# Patient Record
Sex: Male | Born: 1935
Health system: Southern US, Community
[De-identification: ages and names within clinical notes are randomized; demographics above are authoritative.]

## PROBLEM LIST (undated history)

## (undated) DIAGNOSIS — E785 Hyperlipidemia, unspecified: Secondary | ICD-10-CM

## (undated) DIAGNOSIS — J309 Allergic rhinitis, unspecified: Secondary | ICD-10-CM

## (undated) DIAGNOSIS — K5732 Diverticulitis of large intestine without perforation or abscess without bleeding: Secondary | ICD-10-CM

## (undated) DIAGNOSIS — I10 Essential (primary) hypertension: Secondary | ICD-10-CM

## (undated) DIAGNOSIS — I69398 Other sequelae of cerebral infarction: Secondary | ICD-10-CM

## (undated) DIAGNOSIS — Z9861 Coronary angioplasty status: Secondary | ICD-10-CM

## (undated) DIAGNOSIS — K219 Gastro-esophageal reflux disease without esophagitis: Secondary | ICD-10-CM

## (undated) DIAGNOSIS — I452 Bifascicular block: Secondary | ICD-10-CM

## (undated) DIAGNOSIS — R209 Unspecified disturbances of skin sensation: Secondary | ICD-10-CM

## (undated) DIAGNOSIS — K5900 Constipation, unspecified: Secondary | ICD-10-CM

## (undated) DIAGNOSIS — F329 Major depressive disorder, single episode, unspecified: Secondary | ICD-10-CM

## (undated) DIAGNOSIS — I2510 Atherosclerotic heart disease of native coronary artery without angina pectoris: Secondary | ICD-10-CM

## (undated) DIAGNOSIS — M549 Dorsalgia, unspecified: Secondary | ICD-10-CM

## (undated) DIAGNOSIS — G8929 Other chronic pain: Secondary | ICD-10-CM

## (undated) DIAGNOSIS — F32A Depression, unspecified: Secondary | ICD-10-CM

## (undated) DIAGNOSIS — G062 Extradural and subdural abscess, unspecified: Secondary | ICD-10-CM

## (undated) DIAGNOSIS — M4800 Spinal stenosis, site unspecified: Secondary | ICD-10-CM

## (undated) DIAGNOSIS — M5137 Other intervertebral disc degeneration, lumbosacral region: Secondary | ICD-10-CM

## (undated) DIAGNOSIS — M5030 Other cervical disc degeneration, unspecified cervical region: Secondary | ICD-10-CM

## (undated) HISTORY — DX: Essential (primary) hypertension: I10

## (undated) HISTORY — PX: BACK SURGERY: SHX140

## (undated) HISTORY — DX: Diverticulitis of large intestine without perforation or abscess without bleeding: K57.32

## (undated) HISTORY — DX: Other cervical disc degeneration, unspecified cervical region: M50.30

## (undated) HISTORY — DX: Bifascicular block: I45.2

## (undated) HISTORY — DX: Gastro-esophageal reflux disease without esophagitis: K21.9

## (undated) HISTORY — DX: Spinal stenosis, site unspecified: M48.00

## (undated) HISTORY — DX: Atherosclerotic heart disease of native coronary artery without angina pectoris: I25.10

## (undated) HISTORY — DX: Other chronic pain: G89.29

## (undated) HISTORY — DX: Unspecified disturbances of skin sensation: R20.9

## (undated) HISTORY — DX: Dorsalgia, unspecified: M54.9

## (undated) HISTORY — DX: Constipation, unspecified: K59.00

## (undated) HISTORY — DX: Other sequelae of cerebral infarction: I69.398

## (undated) HISTORY — DX: Allergic rhinitis, unspecified: J30.9

## (undated) HISTORY — DX: Extradural and subdural abscess, unspecified: G06.2

## (undated) HISTORY — DX: Other intervertebral disc degeneration, lumbosacral region: M51.37

## (undated) HISTORY — DX: Coronary angioplasty status: Z98.61

## (undated) HISTORY — DX: Major depressive disorder, single episode, unspecified: F32.9

## (undated) HISTORY — DX: Depression, unspecified: F32.A

## (undated) HISTORY — PX: MOUTH SURGERY: SHX715

## (undated) HISTORY — DX: Hyperlipidemia, unspecified: E78.5

## (undated) HISTORY — PX: LUMBAR DISC SURGERY: SHX700

---

## 2007-06-10 DIAGNOSIS — M51379 Other intervertebral disc degeneration, lumbosacral region without mention of lumbar back pain or lower extremity pain: Secondary | ICD-10-CM

## 2007-06-10 DIAGNOSIS — M5137 Other intervertebral disc degeneration, lumbosacral region: Secondary | ICD-10-CM

## 2007-06-10 HISTORY — DX: Other intervertebral disc degeneration, lumbosacral region without mention of lumbar back pain or lower extremity pain: M51.379

## 2007-06-10 HISTORY — DX: Other intervertebral disc degeneration, lumbosacral region: M51.37

## 2007-08-14 HISTORY — PX: LAMINECTOMY: SHX219

## 2009-06-09 DIAGNOSIS — K5900 Constipation, unspecified: Secondary | ICD-10-CM

## 2009-06-09 HISTORY — DX: Constipation, unspecified: K59.00

## 2014-08-24 DIAGNOSIS — M5416 Radiculopathy, lumbar region: Secondary | ICD-10-CM | POA: Diagnosis not present

## 2014-09-24 DIAGNOSIS — M961 Postlaminectomy syndrome, not elsewhere classified: Secondary | ICD-10-CM | POA: Diagnosis not present

## 2014-09-24 DIAGNOSIS — M5136 Other intervertebral disc degeneration, lumbar region: Secondary | ICD-10-CM | POA: Diagnosis not present

## 2014-09-24 DIAGNOSIS — M4322 Fusion of spine, cervical region: Secondary | ICD-10-CM | POA: Diagnosis not present

## 2014-11-29 DIAGNOSIS — Z961 Presence of intraocular lens: Secondary | ICD-10-CM | POA: Diagnosis not present

## 2014-11-29 DIAGNOSIS — H04123 Dry eye syndrome of bilateral lacrimal glands: Secondary | ICD-10-CM | POA: Diagnosis not present

## 2014-12-10 DIAGNOSIS — S86912A Strain of unspecified muscle(s) and tendon(s) at lower leg level, left leg, initial encounter: Secondary | ICD-10-CM | POA: Diagnosis not present

## 2014-12-10 DIAGNOSIS — M79605 Pain in left leg: Secondary | ICD-10-CM | POA: Diagnosis not present

## 2014-12-10 DIAGNOSIS — Z9889 Other specified postprocedural states: Secondary | ICD-10-CM | POA: Diagnosis not present

## 2014-12-28 DIAGNOSIS — L821 Other seborrheic keratosis: Secondary | ICD-10-CM | POA: Diagnosis not present

## 2014-12-28 DIAGNOSIS — D485 Neoplasm of uncertain behavior of skin: Secondary | ICD-10-CM | POA: Diagnosis not present

## 2014-12-28 DIAGNOSIS — L820 Inflamed seborrheic keratosis: Secondary | ICD-10-CM | POA: Diagnosis not present

## 2014-12-28 DIAGNOSIS — C44319 Basal cell carcinoma of skin of other parts of face: Secondary | ICD-10-CM | POA: Diagnosis not present

## 2014-12-28 DIAGNOSIS — L309 Dermatitis, unspecified: Secondary | ICD-10-CM | POA: Diagnosis not present

## 2015-01-05 DIAGNOSIS — C44319 Basal cell carcinoma of skin of other parts of face: Secondary | ICD-10-CM | POA: Diagnosis not present

## 2015-01-05 DIAGNOSIS — C61 Malignant neoplasm of prostate: Secondary | ICD-10-CM | POA: Diagnosis not present

## 2015-01-05 DIAGNOSIS — L820 Inflamed seborrheic keratosis: Secondary | ICD-10-CM | POA: Diagnosis not present

## 2015-01-06 DIAGNOSIS — M21372 Foot drop, left foot: Secondary | ICD-10-CM | POA: Diagnosis not present

## 2015-01-06 DIAGNOSIS — M961 Postlaminectomy syndrome, not elsewhere classified: Secondary | ICD-10-CM | POA: Diagnosis not present

## 2015-01-06 DIAGNOSIS — M4322 Fusion of spine, cervical region: Secondary | ICD-10-CM | POA: Diagnosis not present

## 2015-01-06 DIAGNOSIS — M5136 Other intervertebral disc degeneration, lumbar region: Secondary | ICD-10-CM | POA: Diagnosis not present

## 2015-01-14 DIAGNOSIS — M4806 Spinal stenosis, lumbar region: Secondary | ICD-10-CM | POA: Diagnosis not present

## 2015-01-14 DIAGNOSIS — M47817 Spondylosis without myelopathy or radiculopathy, lumbosacral region: Secondary | ICD-10-CM | POA: Diagnosis not present

## 2015-01-14 DIAGNOSIS — M5124 Other intervertebral disc displacement, thoracic region: Secondary | ICD-10-CM | POA: Diagnosis not present

## 2015-01-14 DIAGNOSIS — M5126 Other intervertebral disc displacement, lumbar region: Secondary | ICD-10-CM | POA: Diagnosis not present

## 2015-01-25 DIAGNOSIS — G894 Chronic pain syndrome: Secondary | ICD-10-CM | POA: Diagnosis not present

## 2015-01-25 DIAGNOSIS — M533 Sacrococcygeal disorders, not elsewhere classified: Secondary | ICD-10-CM | POA: Diagnosis not present

## 2015-01-25 DIAGNOSIS — Z5181 Encounter for therapeutic drug level monitoring: Secondary | ICD-10-CM | POA: Diagnosis not present

## 2015-01-25 DIAGNOSIS — G8929 Other chronic pain: Secondary | ICD-10-CM | POA: Diagnosis not present

## 2015-01-25 DIAGNOSIS — M961 Postlaminectomy syndrome, not elsewhere classified: Secondary | ICD-10-CM | POA: Diagnosis not present

## 2015-01-25 DIAGNOSIS — Z79899 Other long term (current) drug therapy: Secondary | ICD-10-CM | POA: Diagnosis not present

## 2015-01-26 DIAGNOSIS — I2510 Atherosclerotic heart disease of native coronary artery without angina pectoris: Secondary | ICD-10-CM | POA: Diagnosis not present

## 2015-01-26 DIAGNOSIS — R7301 Impaired fasting glucose: Secondary | ICD-10-CM | POA: Diagnosis not present

## 2015-01-26 DIAGNOSIS — E785 Hyperlipidemia, unspecified: Secondary | ICD-10-CM | POA: Diagnosis not present

## 2015-01-26 DIAGNOSIS — I10 Essential (primary) hypertension: Secondary | ICD-10-CM | POA: Diagnosis not present

## 2015-02-01 DIAGNOSIS — M533 Sacrococcygeal disorders, not elsewhere classified: Secondary | ICD-10-CM | POA: Diagnosis not present

## 2015-02-23 DIAGNOSIS — M533 Sacrococcygeal disorders, not elsewhere classified: Secondary | ICD-10-CM | POA: Diagnosis not present

## 2015-02-23 DIAGNOSIS — M1990 Unspecified osteoarthritis, unspecified site: Secondary | ICD-10-CM | POA: Diagnosis not present

## 2015-02-23 DIAGNOSIS — M961 Postlaminectomy syndrome, not elsewhere classified: Secondary | ICD-10-CM | POA: Diagnosis not present

## 2015-02-23 DIAGNOSIS — T7840XA Allergy, unspecified, initial encounter: Secondary | ICD-10-CM | POA: Diagnosis not present

## 2015-02-23 DIAGNOSIS — I2510 Atherosclerotic heart disease of native coronary artery without angina pectoris: Secondary | ICD-10-CM | POA: Diagnosis not present

## 2015-03-04 DIAGNOSIS — G4762 Sleep related leg cramps: Secondary | ICD-10-CM | POA: Diagnosis not present

## 2015-03-04 DIAGNOSIS — I2510 Atherosclerotic heart disease of native coronary artery without angina pectoris: Secondary | ICD-10-CM | POA: Diagnosis not present

## 2015-03-04 DIAGNOSIS — M5137 Other intervertebral disc degeneration, lumbosacral region: Secondary | ICD-10-CM | POA: Diagnosis not present

## 2015-03-04 DIAGNOSIS — M4806 Spinal stenosis, lumbar region: Secondary | ICD-10-CM | POA: Diagnosis not present

## 2015-03-23 DIAGNOSIS — M5388 Other specified dorsopathies, sacral and sacrococcygeal region: Secondary | ICD-10-CM | POA: Diagnosis not present

## 2015-03-31 DIAGNOSIS — L720 Epidermal cyst: Secondary | ICD-10-CM | POA: Diagnosis not present

## 2015-03-31 DIAGNOSIS — Z08 Encounter for follow-up examination after completed treatment for malignant neoplasm: Secondary | ICD-10-CM | POA: Diagnosis not present

## 2015-03-31 DIAGNOSIS — Z85828 Personal history of other malignant neoplasm of skin: Secondary | ICD-10-CM | POA: Diagnosis not present

## 2015-03-31 DIAGNOSIS — L905 Scar conditions and fibrosis of skin: Secondary | ICD-10-CM | POA: Diagnosis not present

## 2015-04-20 DIAGNOSIS — I2510 Atherosclerotic heart disease of native coronary artery without angina pectoris: Secondary | ICD-10-CM | POA: Diagnosis not present

## 2015-04-20 DIAGNOSIS — M79672 Pain in left foot: Secondary | ICD-10-CM | POA: Diagnosis not present

## 2015-04-20 DIAGNOSIS — G894 Chronic pain syndrome: Secondary | ICD-10-CM | POA: Diagnosis not present

## 2015-04-20 DIAGNOSIS — M1990 Unspecified osteoarthritis, unspecified site: Secondary | ICD-10-CM | POA: Diagnosis not present

## 2015-04-20 DIAGNOSIS — M961 Postlaminectomy syndrome, not elsewhere classified: Secondary | ICD-10-CM | POA: Diagnosis not present

## 2015-05-17 DIAGNOSIS — M1990 Unspecified osteoarthritis, unspecified site: Secondary | ICD-10-CM | POA: Diagnosis not present

## 2015-05-17 DIAGNOSIS — Z23 Encounter for immunization: Secondary | ICD-10-CM | POA: Diagnosis not present

## 2015-05-17 DIAGNOSIS — I2510 Atherosclerotic heart disease of native coronary artery without angina pectoris: Secondary | ICD-10-CM | POA: Diagnosis not present

## 2015-05-17 DIAGNOSIS — M79672 Pain in left foot: Secondary | ICD-10-CM | POA: Diagnosis not present

## 2015-05-17 DIAGNOSIS — M961 Postlaminectomy syndrome, not elsewhere classified: Secondary | ICD-10-CM | POA: Diagnosis not present

## 2015-05-17 DIAGNOSIS — G894 Chronic pain syndrome: Secondary | ICD-10-CM | POA: Diagnosis not present

## 2015-05-18 DIAGNOSIS — R42 Dizziness and giddiness: Secondary | ICD-10-CM | POA: Diagnosis not present

## 2015-05-18 DIAGNOSIS — W19XXXA Unspecified fall, initial encounter: Secondary | ICD-10-CM | POA: Diagnosis not present

## 2015-05-18 DIAGNOSIS — G319 Degenerative disease of nervous system, unspecified: Secondary | ICD-10-CM | POA: Diagnosis not present

## 2015-05-18 DIAGNOSIS — N390 Urinary tract infection, site not specified: Secondary | ICD-10-CM | POA: Diagnosis not present

## 2015-05-31 DIAGNOSIS — G8929 Other chronic pain: Secondary | ICD-10-CM | POA: Diagnosis not present

## 2015-06-30 DIAGNOSIS — Z85828 Personal history of other malignant neoplasm of skin: Secondary | ICD-10-CM | POA: Diagnosis not present

## 2015-06-30 DIAGNOSIS — L905 Scar conditions and fibrosis of skin: Secondary | ICD-10-CM | POA: Diagnosis not present

## 2015-06-30 DIAGNOSIS — L570 Actinic keratosis: Secondary | ICD-10-CM | POA: Diagnosis not present

## 2015-06-30 DIAGNOSIS — Z08 Encounter for follow-up examination after completed treatment for malignant neoplasm: Secondary | ICD-10-CM | POA: Diagnosis not present

## 2015-06-30 DIAGNOSIS — L821 Other seborrheic keratosis: Secondary | ICD-10-CM | POA: Diagnosis not present

## 2015-07-04 DIAGNOSIS — M961 Postlaminectomy syndrome, not elsewhere classified: Secondary | ICD-10-CM | POA: Diagnosis not present

## 2015-07-04 DIAGNOSIS — G8929 Other chronic pain: Secondary | ICD-10-CM | POA: Diagnosis not present

## 2015-07-04 DIAGNOSIS — M533 Sacrococcygeal disorders, not elsewhere classified: Secondary | ICD-10-CM | POA: Diagnosis not present

## 2015-07-04 DIAGNOSIS — G894 Chronic pain syndrome: Secondary | ICD-10-CM | POA: Diagnosis not present

## 2015-07-19 DIAGNOSIS — M533 Sacrococcygeal disorders, not elsewhere classified: Secondary | ICD-10-CM | POA: Diagnosis not present

## 2015-07-19 DIAGNOSIS — M47814 Spondylosis without myelopathy or radiculopathy, thoracic region: Secondary | ICD-10-CM | POA: Diagnosis not present

## 2015-07-19 DIAGNOSIS — Z981 Arthrodesis status: Secondary | ICD-10-CM | POA: Diagnosis not present

## 2015-08-16 DIAGNOSIS — Z0000 Encounter for general adult medical examination without abnormal findings: Secondary | ICD-10-CM | POA: Diagnosis not present

## 2015-08-16 DIAGNOSIS — E785 Hyperlipidemia, unspecified: Secondary | ICD-10-CM | POA: Diagnosis not present

## 2015-08-16 DIAGNOSIS — R7301 Impaired fasting glucose: Secondary | ICD-10-CM | POA: Diagnosis not present

## 2015-08-16 DIAGNOSIS — Z125 Encounter for screening for malignant neoplasm of prostate: Secondary | ICD-10-CM | POA: Diagnosis not present

## 2015-08-16 DIAGNOSIS — I2510 Atherosclerotic heart disease of native coronary artery without angina pectoris: Secondary | ICD-10-CM | POA: Diagnosis not present

## 2015-08-16 DIAGNOSIS — I10 Essential (primary) hypertension: Secondary | ICD-10-CM | POA: Diagnosis not present

## 2015-08-16 DIAGNOSIS — R1319 Other dysphagia: Secondary | ICD-10-CM | POA: Diagnosis not present

## 2015-08-29 DIAGNOSIS — G894 Chronic pain syndrome: Secondary | ICD-10-CM | POA: Diagnosis not present

## 2015-08-29 DIAGNOSIS — M546 Pain in thoracic spine: Secondary | ICD-10-CM | POA: Diagnosis not present

## 2015-08-29 DIAGNOSIS — M533 Sacrococcygeal disorders, not elsewhere classified: Secondary | ICD-10-CM | POA: Diagnosis not present

## 2015-08-29 DIAGNOSIS — M961 Postlaminectomy syndrome, not elsewhere classified: Secondary | ICD-10-CM | POA: Diagnosis not present

## 2015-09-18 DIAGNOSIS — I2510 Atherosclerotic heart disease of native coronary artery without angina pectoris: Secondary | ICD-10-CM | POA: Diagnosis not present

## 2015-09-18 DIAGNOSIS — Z884 Allergy status to anesthetic agent status: Secondary | ICD-10-CM | POA: Diagnosis not present

## 2015-09-18 DIAGNOSIS — Z85828 Personal history of other malignant neoplasm of skin: Secondary | ICD-10-CM | POA: Diagnosis not present

## 2015-09-18 DIAGNOSIS — J45909 Unspecified asthma, uncomplicated: Secondary | ICD-10-CM | POA: Diagnosis not present

## 2015-09-18 DIAGNOSIS — I10 Essential (primary) hypertension: Secondary | ICD-10-CM | POA: Diagnosis not present

## 2015-09-18 DIAGNOSIS — Z8546 Personal history of malignant neoplasm of prostate: Secondary | ICD-10-CM | POA: Diagnosis not present

## 2015-09-18 DIAGNOSIS — M1990 Unspecified osteoarthritis, unspecified site: Secondary | ICD-10-CM | POA: Diagnosis not present

## 2015-09-18 DIAGNOSIS — Z885 Allergy status to narcotic agent status: Secondary | ICD-10-CM | POA: Diagnosis not present

## 2015-09-18 DIAGNOSIS — R0789 Other chest pain: Secondary | ICD-10-CM | POA: Diagnosis not present

## 2015-09-18 DIAGNOSIS — Z888 Allergy status to other drugs, medicaments and biological substances status: Secondary | ICD-10-CM | POA: Diagnosis not present

## 2015-09-18 DIAGNOSIS — R0600 Dyspnea, unspecified: Secondary | ICD-10-CM | POA: Diagnosis not present

## 2015-09-18 DIAGNOSIS — Z79899 Other long term (current) drug therapy: Secondary | ICD-10-CM | POA: Diagnosis not present

## 2015-09-18 DIAGNOSIS — K219 Gastro-esophageal reflux disease without esophagitis: Secondary | ICD-10-CM | POA: Diagnosis not present

## 2015-09-18 DIAGNOSIS — R001 Bradycardia, unspecified: Secondary | ICD-10-CM | POA: Diagnosis not present

## 2015-09-18 DIAGNOSIS — M549 Dorsalgia, unspecified: Secondary | ICD-10-CM | POA: Diagnosis not present

## 2015-09-18 DIAGNOSIS — Z7982 Long term (current) use of aspirin: Secondary | ICD-10-CM | POA: Diagnosis not present

## 2015-09-18 DIAGNOSIS — I252 Old myocardial infarction: Secondary | ICD-10-CM | POA: Diagnosis not present

## 2015-09-18 DIAGNOSIS — Z87891 Personal history of nicotine dependence: Secondary | ICD-10-CM | POA: Diagnosis not present

## 2015-09-18 DIAGNOSIS — R079 Chest pain, unspecified: Secondary | ICD-10-CM | POA: Diagnosis not present

## 2015-09-26 DIAGNOSIS — R1013 Epigastric pain: Secondary | ICD-10-CM | POA: Diagnosis not present

## 2015-09-26 DIAGNOSIS — R140 Abdominal distension (gaseous): Secondary | ICD-10-CM | POA: Diagnosis not present

## 2015-09-26 DIAGNOSIS — D649 Anemia, unspecified: Secondary | ICD-10-CM | POA: Diagnosis not present

## 2015-09-26 DIAGNOSIS — R1310 Dysphagia, unspecified: Secondary | ICD-10-CM | POA: Diagnosis not present

## 2015-10-04 DIAGNOSIS — R1310 Dysphagia, unspecified: Secondary | ICD-10-CM | POA: Diagnosis not present

## 2015-10-04 DIAGNOSIS — K3189 Other diseases of stomach and duodenum: Secondary | ICD-10-CM | POA: Diagnosis not present

## 2015-10-05 DIAGNOSIS — K2970 Gastritis, unspecified, without bleeding: Secondary | ICD-10-CM | POA: Diagnosis not present

## 2015-10-14 DIAGNOSIS — J208 Acute bronchitis due to other specified organisms: Secondary | ICD-10-CM | POA: Diagnosis not present

## 2015-10-14 DIAGNOSIS — I2510 Atherosclerotic heart disease of native coronary artery without angina pectoris: Secondary | ICD-10-CM | POA: Diagnosis not present

## 2015-11-14 DIAGNOSIS — I2510 Atherosclerotic heart disease of native coronary artery without angina pectoris: Secondary | ICD-10-CM | POA: Diagnosis not present

## 2015-11-14 DIAGNOSIS — M533 Sacrococcygeal disorders, not elsewhere classified: Secondary | ICD-10-CM | POA: Diagnosis not present

## 2015-11-14 DIAGNOSIS — M961 Postlaminectomy syndrome, not elsewhere classified: Secondary | ICD-10-CM | POA: Diagnosis not present

## 2015-12-12 DIAGNOSIS — M533 Sacrococcygeal disorders, not elsewhere classified: Secondary | ICD-10-CM | POA: Diagnosis not present

## 2015-12-12 DIAGNOSIS — G894 Chronic pain syndrome: Secondary | ICD-10-CM | POA: Diagnosis not present

## 2015-12-12 DIAGNOSIS — M961 Postlaminectomy syndrome, not elsewhere classified: Secondary | ICD-10-CM | POA: Diagnosis not present

## 2015-12-12 DIAGNOSIS — G8929 Other chronic pain: Secondary | ICD-10-CM | POA: Diagnosis not present

## 2015-12-19 DIAGNOSIS — M533 Sacrococcygeal disorders, not elsewhere classified: Secondary | ICD-10-CM | POA: Diagnosis not present

## 2015-12-28 DIAGNOSIS — L821 Other seborrheic keratosis: Secondary | ICD-10-CM | POA: Diagnosis not present

## 2015-12-28 DIAGNOSIS — Z08 Encounter for follow-up examination after completed treatment for malignant neoplasm: Secondary | ICD-10-CM | POA: Diagnosis not present

## 2015-12-28 DIAGNOSIS — L570 Actinic keratosis: Secondary | ICD-10-CM | POA: Diagnosis not present

## 2015-12-28 DIAGNOSIS — Z85828 Personal history of other malignant neoplasm of skin: Secondary | ICD-10-CM | POA: Diagnosis not present

## 2016-01-04 DIAGNOSIS — Z961 Presence of intraocular lens: Secondary | ICD-10-CM | POA: Diagnosis not present

## 2016-01-17 DIAGNOSIS — H60501 Unspecified acute noninfective otitis externa, right ear: Secondary | ICD-10-CM | POA: Diagnosis not present

## 2016-01-17 DIAGNOSIS — Z981 Arthrodesis status: Secondary | ICD-10-CM | POA: Diagnosis not present

## 2016-01-17 DIAGNOSIS — I2510 Atherosclerotic heart disease of native coronary artery without angina pectoris: Secondary | ICD-10-CM | POA: Diagnosis not present

## 2016-01-17 DIAGNOSIS — M47812 Spondylosis without myelopathy or radiculopathy, cervical region: Secondary | ICD-10-CM | POA: Diagnosis not present

## 2016-01-17 DIAGNOSIS — M542 Cervicalgia: Secondary | ICD-10-CM | POA: Diagnosis not present

## 2016-01-17 DIAGNOSIS — M533 Sacrococcygeal disorders, not elsewhere classified: Secondary | ICD-10-CM | POA: Diagnosis not present

## 2016-01-17 DIAGNOSIS — G894 Chronic pain syndrome: Secondary | ICD-10-CM | POA: Diagnosis not present

## 2016-01-17 DIAGNOSIS — Z4789 Encounter for other orthopedic aftercare: Secondary | ICD-10-CM | POA: Diagnosis not present

## 2016-02-08 DIAGNOSIS — M5388 Other specified dorsopathies, sacral and sacrococcygeal region: Secondary | ICD-10-CM | POA: Diagnosis not present

## 2016-02-08 DIAGNOSIS — M791 Myalgia: Secondary | ICD-10-CM | POA: Diagnosis not present

## 2016-03-01 DIAGNOSIS — E785 Hyperlipidemia, unspecified: Secondary | ICD-10-CM | POA: Diagnosis not present

## 2016-03-01 DIAGNOSIS — I10 Essential (primary) hypertension: Secondary | ICD-10-CM | POA: Diagnosis not present

## 2016-03-01 DIAGNOSIS — R7301 Impaired fasting glucose: Secondary | ICD-10-CM | POA: Diagnosis not present

## 2016-03-01 DIAGNOSIS — I2510 Atherosclerotic heart disease of native coronary artery without angina pectoris: Secondary | ICD-10-CM | POA: Diagnosis not present

## 2016-03-07 DIAGNOSIS — M533 Sacrococcygeal disorders, not elsewhere classified: Secondary | ICD-10-CM | POA: Diagnosis not present

## 2016-03-07 DIAGNOSIS — M961 Postlaminectomy syndrome, not elsewhere classified: Secondary | ICD-10-CM | POA: Diagnosis not present

## 2016-03-07 DIAGNOSIS — M791 Myalgia: Secondary | ICD-10-CM | POA: Diagnosis not present

## 2016-03-07 DIAGNOSIS — G894 Chronic pain syndrome: Secondary | ICD-10-CM | POA: Diagnosis not present

## 2016-03-22 DIAGNOSIS — I10 Essential (primary) hypertension: Secondary | ICD-10-CM | POA: Diagnosis not present

## 2016-03-22 DIAGNOSIS — E785 Hyperlipidemia, unspecified: Secondary | ICD-10-CM | POA: Diagnosis not present

## 2016-03-22 DIAGNOSIS — I2510 Atherosclerotic heart disease of native coronary artery without angina pectoris: Secondary | ICD-10-CM | POA: Diagnosis not present

## 2016-04-19 DIAGNOSIS — G8929 Other chronic pain: Secondary | ICD-10-CM | POA: Diagnosis not present

## 2016-04-19 DIAGNOSIS — M533 Sacrococcygeal disorders, not elsewhere classified: Secondary | ICD-10-CM | POA: Diagnosis not present

## 2016-04-25 DIAGNOSIS — G8929 Other chronic pain: Secondary | ICD-10-CM | POA: Diagnosis not present

## 2016-04-25 DIAGNOSIS — M47816 Spondylosis without myelopathy or radiculopathy, lumbar region: Secondary | ICD-10-CM | POA: Diagnosis not present

## 2016-04-25 DIAGNOSIS — M545 Low back pain: Secondary | ICD-10-CM | POA: Diagnosis not present

## 2016-05-11 DIAGNOSIS — I10 Essential (primary) hypertension: Secondary | ICD-10-CM | POA: Diagnosis not present

## 2016-05-11 DIAGNOSIS — R490 Dysphonia: Secondary | ICD-10-CM | POA: Diagnosis not present

## 2016-05-11 DIAGNOSIS — B370 Candidal stomatitis: Secondary | ICD-10-CM | POA: Diagnosis not present

## 2016-05-11 DIAGNOSIS — K219 Gastro-esophageal reflux disease without esophagitis: Secondary | ICD-10-CM | POA: Diagnosis not present

## 2016-05-11 DIAGNOSIS — I2510 Atherosclerotic heart disease of native coronary artery without angina pectoris: Secondary | ICD-10-CM | POA: Diagnosis not present

## 2016-05-14 DIAGNOSIS — M47816 Spondylosis without myelopathy or radiculopathy, lumbar region: Secondary | ICD-10-CM | POA: Diagnosis not present

## 2016-05-28 DIAGNOSIS — Z6826 Body mass index (BMI) 26.0-26.9, adult: Secondary | ICD-10-CM | POA: Diagnosis not present

## 2016-05-28 DIAGNOSIS — G8929 Other chronic pain: Secondary | ICD-10-CM | POA: Diagnosis not present

## 2016-05-28 DIAGNOSIS — M545 Low back pain: Secondary | ICD-10-CM | POA: Diagnosis not present

## 2016-05-28 DIAGNOSIS — I10 Essential (primary) hypertension: Secondary | ICD-10-CM | POA: Diagnosis not present

## 2016-05-28 DIAGNOSIS — M47816 Spondylosis without myelopathy or radiculopathy, lumbar region: Secondary | ICD-10-CM | POA: Diagnosis not present

## 2016-05-29 DIAGNOSIS — K219 Gastro-esophageal reflux disease without esophagitis: Secondary | ICD-10-CM | POA: Diagnosis not present

## 2016-05-29 DIAGNOSIS — J312 Chronic pharyngitis: Secondary | ICD-10-CM | POA: Diagnosis not present

## 2016-05-29 DIAGNOSIS — R1310 Dysphagia, unspecified: Secondary | ICD-10-CM | POA: Diagnosis not present

## 2016-05-29 DIAGNOSIS — R490 Dysphonia: Secondary | ICD-10-CM | POA: Diagnosis not present

## 2016-06-08 DIAGNOSIS — Z23 Encounter for immunization: Secondary | ICD-10-CM | POA: Diagnosis not present

## 2016-06-13 DIAGNOSIS — K219 Gastro-esophageal reflux disease without esophagitis: Secondary | ICD-10-CM | POA: Diagnosis not present

## 2016-06-13 DIAGNOSIS — I10 Essential (primary) hypertension: Secondary | ICD-10-CM | POA: Diagnosis not present

## 2016-06-13 DIAGNOSIS — M549 Dorsalgia, unspecified: Secondary | ICD-10-CM | POA: Diagnosis not present

## 2016-06-13 DIAGNOSIS — J309 Allergic rhinitis, unspecified: Secondary | ICD-10-CM | POA: Diagnosis not present

## 2016-06-13 DIAGNOSIS — I2510 Atherosclerotic heart disease of native coronary artery without angina pectoris: Secondary | ICD-10-CM | POA: Diagnosis not present

## 2016-07-02 DIAGNOSIS — M47816 Spondylosis without myelopathy or radiculopathy, lumbar region: Secondary | ICD-10-CM | POA: Diagnosis not present

## 2016-07-10 DIAGNOSIS — K219 Gastro-esophageal reflux disease without esophagitis: Secondary | ICD-10-CM | POA: Diagnosis not present

## 2016-08-01 DIAGNOSIS — L570 Actinic keratosis: Secondary | ICD-10-CM | POA: Diagnosis not present

## 2016-08-01 DIAGNOSIS — D1801 Hemangioma of skin and subcutaneous tissue: Secondary | ICD-10-CM | POA: Diagnosis not present

## 2016-08-01 DIAGNOSIS — L821 Other seborrheic keratosis: Secondary | ICD-10-CM | POA: Diagnosis not present

## 2016-08-01 DIAGNOSIS — D692 Other nonthrombocytopenic purpura: Secondary | ICD-10-CM | POA: Diagnosis not present

## 2016-08-01 DIAGNOSIS — Z85828 Personal history of other malignant neoplasm of skin: Secondary | ICD-10-CM | POA: Diagnosis not present

## 2016-08-14 DIAGNOSIS — Z6826 Body mass index (BMI) 26.0-26.9, adult: Secondary | ICD-10-CM | POA: Diagnosis not present

## 2016-08-14 DIAGNOSIS — M545 Low back pain: Secondary | ICD-10-CM | POA: Diagnosis not present

## 2016-08-14 DIAGNOSIS — I10 Essential (primary) hypertension: Secondary | ICD-10-CM | POA: Diagnosis not present

## 2016-08-22 DIAGNOSIS — M533 Sacrococcygeal disorders, not elsewhere classified: Secondary | ICD-10-CM | POA: Diagnosis not present

## 2016-08-29 ENCOUNTER — Ambulatory Visit: Payer: Medicare Other | Admitting: Cardiology

## 2016-09-03 DIAGNOSIS — M533 Sacrococcygeal disorders, not elsewhere classified: Secondary | ICD-10-CM | POA: Diagnosis not present

## 2016-09-06 DIAGNOSIS — R7301 Impaired fasting glucose: Secondary | ICD-10-CM | POA: Diagnosis not present

## 2016-09-06 DIAGNOSIS — I2510 Atherosclerotic heart disease of native coronary artery without angina pectoris: Secondary | ICD-10-CM | POA: Diagnosis not present

## 2016-09-06 DIAGNOSIS — Z0000 Encounter for general adult medical examination without abnormal findings: Secondary | ICD-10-CM | POA: Diagnosis not present

## 2016-09-06 DIAGNOSIS — I10 Essential (primary) hypertension: Secondary | ICD-10-CM | POA: Diagnosis not present

## 2016-09-06 DIAGNOSIS — M549 Dorsalgia, unspecified: Secondary | ICD-10-CM | POA: Diagnosis not present

## 2016-09-06 DIAGNOSIS — Z9079 Acquired absence of other genital organ(s): Secondary | ICD-10-CM | POA: Diagnosis not present

## 2016-09-06 DIAGNOSIS — Z125 Encounter for screening for malignant neoplasm of prostate: Secondary | ICD-10-CM | POA: Diagnosis not present

## 2016-09-18 DIAGNOSIS — Z6825 Body mass index (BMI) 25.0-25.9, adult: Secondary | ICD-10-CM | POA: Diagnosis not present

## 2016-09-18 DIAGNOSIS — I10 Essential (primary) hypertension: Secondary | ICD-10-CM | POA: Diagnosis not present

## 2016-09-18 DIAGNOSIS — M533 Sacrococcygeal disorders, not elsewhere classified: Secondary | ICD-10-CM | POA: Diagnosis not present

## 2016-09-19 ENCOUNTER — Ambulatory Visit (INDEPENDENT_AMBULATORY_CARE_PROVIDER_SITE_OTHER): Payer: Medicare Other | Admitting: Cardiology

## 2016-09-19 ENCOUNTER — Encounter: Payer: Self-pay | Admitting: Cardiology

## 2016-09-19 VITALS — BP 144/66 | HR 62 | Ht 67.0 in | Wt 169.0 lb

## 2016-09-19 DIAGNOSIS — E7800 Pure hypercholesterolemia, unspecified: Secondary | ICD-10-CM | POA: Diagnosis not present

## 2016-09-19 DIAGNOSIS — I10 Essential (primary) hypertension: Secondary | ICD-10-CM

## 2016-09-19 DIAGNOSIS — I2510 Atherosclerotic heart disease of native coronary artery without angina pectoris: Secondary | ICD-10-CM

## 2016-09-19 MED ORDER — LOSARTAN POTASSIUM 25 MG PO TABS: 25.0000 mg | ORAL_TABLET | Freq: Every day | ORAL | 6 refills | Status: DC

## 2016-09-19 NOTE — Patient Instructions (Signed)
Medication Instructions:  Your physician has recommended you make the following change in your medication:  Start Losartan 25 mg by mouth daily.   Labwork: none  Testing/Procedures: none  Follow-Up: Your physician wants you to follow-up in: 6 months.  You will receive a reminder letter in the mail two months in advance. If you don't receive a letter, please call our office to schedule the follow-up appointment.   Any Other Special Instructions Will Be Listed Below (If Applicable).     If you need a refill on your cardiac medications before your next appointment, please call your pharmacy.

## 2016-09-19 NOTE — Progress Notes (Signed)
Cardiology Office Note    Date:  09/20/2016   ID:  Christopher Ferguson, DOB 1936/04/28, MRN [id number]  PCP:  Dineen Kid, MD  Cardiologist:   Candee Furbish, MD     History of Present Illness:  Christopher Ferguson is a 81 y.o. male here to establish care with coronary artery disease, bare-metal stent to LAD in 2009, dyslipidemia, essential hypertension. No significant anginal pain. Has had back pain, musculoskeletal. Has mild chronic shortness of breath as well as deconditioning. No orthopnea, no palpitations, no syncope. Blood pressure has been followed by PCP.  4 stents in total 3 in 2001 in setting of NSTEMI, 1 stent placed 2009 secondary to angina.  Retired Company secretary  Overall he feels well, no syncope, no chest pain, no orthopnea, no PND, no bleeding. He has been on aspirin and Plavix since 2001. He also has been on atenolol for quite some time. No significant bradycardia noted.  Cardiology overview per Dr. Kenton Kingfisher:  - cath 9/09: no change 2/09: stent-pat-LAD, haziness in mLAD stent. Ectatic RCA,100Lcx - Cardiolite 3/09: negative  -cath-09/27/07: 60-70RCA, 100Lcx, 75%LAD, EF55 - Vision 2.5x18->LAD  Prior radical prostatectomy in 1997. GERD. Prior thoracic back surgery of the C5-7. Lumbar back surgery as well.  Past Medical History:  Diagnosis Date  . AR (allergic rhinitis)   . CAD (coronary artery disease)   . Chronic back pain   . Constipation, unspecified 06/09/2009  . CVA, old, alterations of sensations   . Degeneration of cervical intervertebral disc   . Degeneration of lumbar or lumbosacral intervertebral disc 06/10/2007  . Depression   . Diverticulitis of colon without hemorrhage   . Dyslipidemia   . Epidural abscess   . GERD (gastroesophageal reflux disease)   . Hypertension   . Spinal stenosis    HAD SURGERY    Past Surgical History:  Procedure Laterality Date  . BACK SURGERY    . LAMINECTOMY  2009   OF C6-C7  . LUMBAR DISC SURGERY    . MOUTH SURGERY      Current  Medications: Outpatient Medications Prior to Visit  Medication Sig Dispense Refill  . aspirin EC 81 MG tablet Take 81 mg by mouth daily.    Marland Kitchen atenolol (TENORMIN) 25 MG tablet Take by mouth as directed. PT TAKES 75 MG    . buprenorphine (BUTRANS - DOSED MCG/HR) 10 MCG/HR PTWK patch Place 10 mcg onto the skin once a week.    . clopidogrel (PLAVIX) 75 MG tablet Take 75 mg by mouth daily.    . cyclobenzaprine (FLEXERIL) 10 MG tablet Take 10 mg by mouth 3 (three) times daily as needed for muscle spasms.    . DULoxetine (CYMBALTA) 20 MG capsule Take 20 mg by mouth 2 (two) times daily.    Marland Kitchen esomeprazole (NEXIUM) 40 MG capsule Take 40 mg by mouth daily at 12 noon.    Marland Kitchen Fexofenadine HCl (ALLEGRA PO) Take 1 tablet by mouth as directed.    Marland Kitchen HYDROcodone-acetaminophen (NORCO) 10-325 MG tablet Take 1 tablet by mouth every 6 (six) hours as needed.    . mometasone (NASONEX) 50 MCG/ACT nasal spray Place 2 sprays into the nose daily.    . nitroGLYCERIN (NITROSTAT) 0.4 MG SL tablet Place 0.4 mg under the tongue every 5 (five) minutes as needed for chest pain.    Marland Kitchen OVER THE COUNTER MEDICATION otc tabqua    . polyethylene glycol powder (MIRALAX) powder Take 1 Container by mouth once.    . rosuvastatin (CRESTOR) 40 MG  tablet Take 40 mg by mouth daily.     No facility-administered medications prior to visit.      Allergies:   Codeine; Lidoderm [lidocaine]; and Paxil [paroxetine hcl]   Social History   Social History  . Marital status: Married    Spouse name: N/A  . Number of children: N/A  . Years of education: N/A   Social History Main Topics  . Smoking status: Never Smoker  . Smokeless tobacco: Never Used  . Alcohol use Not on file  . Drug use: Unknown  . Sexual activity: Not on file   Other Topics Concern  . Not on file   Social History Narrative  . No narrative on file     Family History:  The patient's No early family history of CAD.   ROS:   Please see the history of present illness.     ROS All other systems reviewed and are negative.   PHYSICAL EXAM:   VS:  BP (!) 144/66   Pulse 62   Ht 5\' 7"  (1.702 m)   Wt 169 lb (76.7 kg)   BMI 26.47 kg/m    GEN: Well nourished, well developed, in no acute distress  HEENT: normal  Neck: no JVD, carotid bruits, or masses Cardiac: RRR; no murmurs, rubs, or gallops,no edema  Respiratory:  clear to auscultation bilaterally, normal work of breathing GI: soft, nontender, nondistended, + BS MS: no deformity or atrophy  Skin: warm and dry, no rash Neuro:  Alert and Oriented x 3, Strength and sensation are intact Psych: euthymic mood, full affect  Wt Readings from Last 3 Encounters:  09/19/16 169 lb (76.7 kg)      Studies/Labs Reviewed:   EKG:  EKG is ordered today.  The ekg ordered today demonstrates 09/19/16-sinus rhythm, first-degree AV block, 62 bpm with left anterior fascicular block, right bundle branch block, bifascicular block  Recent Labs: No results found for requested labs within last 8760 hours.   Lipid Panel No results found for: CHOL, TRIG, HDL, CHOLHDL, VLDL, LDLCALC, LDLDIRECT  Additional studies/ records that were reviewed today include:  Prior office notes reviewed, lab work reviewed, EKG reviewed, cardiac catheterization reviewed.    ASSESSMENT:    1. Coronary artery disease involving native coronary artery of native heart without angina pectoris   2. Essential hypertension   3. Pure hypercholesterolemia      PLAN:  In order of problems listed above:  Coronary artery disease  - Vision, bare-metal stent to LAD - previously had 3 stents in 2001  - 100% left circumflex occlusion, 60-70% RCA occlusion, normal ejection fraction. There was haziness described in mid LAD stent previously. No change. Last catheterization 04/2008  Hyperlipidemia  - LDL goal less than 70. Continue with statin therapy. Low HDL noted. Cholesterol levels and blood pressure were previously being followed by his primary care  physician.  Hypertensive heart disease  - Blood pressure currently mildly elevated.  - I will start losartan 25 mg once a day. He has a follow-up visit with Dr. Lynelle Doctor an approximate a month and a half. At that time he was to check basic metabolic panel. He will keep a log of his blood pressures.  Right bundle branch block/left anterior fascicular block  - Reviewed on EKG. No clinical indication for pacemaker. Conduction disease noted.      Medication Adjustments/Labs and Tests Ordered: Current medicines are reviewed at length with the patient today.  Concerns regarding medicines are outlined above.  Medication changes, Labs  and Tests ordered today are listed in the Patient Instructions below. Patient Instructions  Medication Instructions:  Your physician has recommended you make the following change in your medication:  Start Losartan 25 mg by mouth daily.   Labwork: none  Testing/Procedures: none  Follow-Up: Your physician wants you to follow-up in: 6 months.  You will receive a reminder letter in the mail two months in advance. If you don't receive a letter, please call our office to schedule the follow-up appointment.   Any Other Special Instructions Will Be Listed Below (If Applicable).     If you need a refill on your cardiac medications before your next appointment, please call your pharmacy.      Signed, Candee Furbish, MD  09/20/2016 12:17 PM    [facility] [place], [place], [place]  [address] Phone: [phone]; Fax: [phone]

## 2016-09-20 ENCOUNTER — Encounter: Payer: Self-pay | Admitting: Cardiology

## 2016-11-05 DIAGNOSIS — L239 Allergic contact dermatitis, unspecified cause: Secondary | ICD-10-CM | POA: Diagnosis not present

## 2016-11-16 DIAGNOSIS — L308 Other specified dermatitis: Secondary | ICD-10-CM | POA: Diagnosis not present

## 2016-11-16 DIAGNOSIS — Z85828 Personal history of other malignant neoplasm of skin: Secondary | ICD-10-CM | POA: Diagnosis not present

## 2016-12-03 DIAGNOSIS — M533 Sacrococcygeal disorders, not elsewhere classified: Secondary | ICD-10-CM | POA: Diagnosis not present

## 2017-01-14 DIAGNOSIS — Z79891 Long term (current) use of opiate analgesic: Secondary | ICD-10-CM | POA: Diagnosis not present

## 2017-01-14 DIAGNOSIS — Z6825 Body mass index (BMI) 25.0-25.9, adult: Secondary | ICD-10-CM | POA: Diagnosis not present

## 2017-01-14 DIAGNOSIS — M533 Sacrococcygeal disorders, not elsewhere classified: Secondary | ICD-10-CM | POA: Diagnosis not present

## 2017-01-18 ENCOUNTER — Telehealth: Payer: Self-pay | Admitting: Cardiology

## 2017-01-18 DIAGNOSIS — R58 Hemorrhage, not elsewhere classified: Secondary | ICD-10-CM | POA: Diagnosis not present

## 2017-01-18 NOTE — Telephone Encounter (Signed)
New Message:  Please call,concerns about his medicine and a place he have on the top of his right hand.

## 2017-01-18 NOTE — Telephone Encounter (Signed)
Spoke with Pt who is complaining of a pocket of blood on his left hand that is about an inch wide and an inch long who stated it looks like its going to "ooze" blood. Pt thinks it looks worse after every time he takes his Losartan. Advised patient that his Plavix is a blood thinner and it can cause hematomas which is what he is most likely experiencing. I explained it as bruising under the skin with pockets of blood that can form under the skin due to a blood thinner. Pt is concerned that his blood may be too thin. Pt was questioning as to whether or not he should take his medications. I advised patient to take all his medications as prescribed until he hears from a doctor. Pt did not know the Atenolol and Losartan were to control BP. I also advised patient to follow up with his PCP as soon as possible to get the hematoma on his hand evaluated before it becomes infected, especially if it looks like it is oozing blood. I told Pt I would send Dr. Marlou Porch a message to make him aware of what is going on with Pt for further review and recommendations on his medications. I told pt it is important for him to take plavix to prevent blood clots. Pt would like to be notified as soon as possible with Dr. Kingsley Plan recommendations and was wondering if someone could call him over the weekend. I advised to pt it would most likely be Monday of next week before someone got back to him since the office is closed over the weekend. Pt verbalized understanding and stated he would call his PCP and he is awaiting a response from Dr. Marlou Porch. Will send to his nurse as well.

## 2017-01-24 NOTE — Telephone Encounter (Signed)
OK to stop Plavix. Continue ASA 81mg   Candee Furbish, MD

## 2017-01-25 NOTE — Telephone Encounter (Signed)
Pt aware of recommendations.  He is hesitant to discontinue his Plavix because he has been on it for so long.  Advised while it is his decision Dr Marlou Porch did give approval to stop it but to take ASA 81 mg a day.  He states understanding.

## 2017-01-26 DIAGNOSIS — R21 Rash and other nonspecific skin eruption: Secondary | ICD-10-CM | POA: Diagnosis not present

## 2017-01-28 DIAGNOSIS — Z85828 Personal history of other malignant neoplasm of skin: Secondary | ICD-10-CM | POA: Diagnosis not present

## 2017-01-28 DIAGNOSIS — L239 Allergic contact dermatitis, unspecified cause: Secondary | ICD-10-CM | POA: Diagnosis not present

## 2017-03-07 DIAGNOSIS — I10 Essential (primary) hypertension: Secondary | ICD-10-CM | POA: Diagnosis not present

## 2017-03-07 DIAGNOSIS — I2510 Atherosclerotic heart disease of native coronary artery without angina pectoris: Secondary | ICD-10-CM | POA: Diagnosis not present

## 2017-03-07 DIAGNOSIS — K219 Gastro-esophageal reflux disease without esophagitis: Secondary | ICD-10-CM | POA: Diagnosis not present

## 2017-03-11 DIAGNOSIS — M533 Sacrococcygeal disorders, not elsewhere classified: Secondary | ICD-10-CM | POA: Diagnosis not present

## 2017-03-12 DIAGNOSIS — K30 Functional dyspepsia: Secondary | ICD-10-CM | POA: Diagnosis not present

## 2017-03-14 ENCOUNTER — Encounter: Payer: Self-pay | Admitting: Cardiology

## 2017-03-14 ENCOUNTER — Ambulatory Visit (INDEPENDENT_AMBULATORY_CARE_PROVIDER_SITE_OTHER): Payer: Medicare Other | Admitting: Cardiology

## 2017-03-14 VITALS — BP 108/62 | HR 54 | Ht 67.0 in | Wt 165.0 lb

## 2017-03-14 DIAGNOSIS — I452 Bifascicular block: Secondary | ICD-10-CM | POA: Diagnosis not present

## 2017-03-14 DIAGNOSIS — I2510 Atherosclerotic heart disease of native coronary artery without angina pectoris: Secondary | ICD-10-CM | POA: Diagnosis not present

## 2017-03-14 DIAGNOSIS — E7800 Pure hypercholesterolemia, unspecified: Secondary | ICD-10-CM

## 2017-03-14 DIAGNOSIS — I4510 Unspecified right bundle-branch block: Secondary | ICD-10-CM | POA: Diagnosis not present

## 2017-03-14 DIAGNOSIS — I10 Essential (primary) hypertension: Secondary | ICD-10-CM

## 2017-03-14 DIAGNOSIS — I444 Left anterior fascicular block: Secondary | ICD-10-CM

## 2017-03-14 NOTE — Patient Instructions (Signed)

## 2017-03-14 NOTE — Progress Notes (Signed)
Cardiology Office Note    Date:  03/14/2017   ID:  Christopher Ferguson, DOB 1935/12/13, MRN [id number]  PCP:  Dineen Kid, MD  Cardiologist:   Candee Furbish, MD     History of Present Illness:  Christopher Ferguson is a 81 y.o. male here to establish care with coronary artery disease, bare-metal stent to LAD in 2009, dyslipidemia, essential hypertension. No significant anginal pain. Has had back pain, musculoskeletal. Has mild chronic shortness of breath as well as deconditioning. No orthopnea, no palpitations, no syncope. Blood pressure has been followed by PCP.  4 stents in total 3 in 2001 in setting of NSTEMI, 1 stent placed 2009 secondary to angina.  Retired Company secretary  He has been on aspirin and Plavix since 2001. He also has been on atenolol for quite some time. No significant bradycardia noted.  Cardiology overview per Dr. Kenton Kingfisher:  - cath 9/09: no change 2/09: stent-pat-LAD, haziness in mLAD stent. Ectatic RCA,100Lcx - Cardiolite 3/09: negative  -cath-09/27/07: 60-70RCA, 100Lcx, 75%LAD, EF55 - Vision 2.5x18->LAD  Prior radical prostatectomy in 1997. GERD. Prior thoracic back surgery of the C5-7. Lumbar back surgery as well.  03/14/17-overall he is doing well. Has chronic back pain, takes medication for this as well as injections every 3 months. No syncopal, no chest pain. Has a bifascicular block and has not had any recent issues. Has been on atenolol for many years.  Past Medical History:  Diagnosis Date  . AR (allergic rhinitis)   . CAD (coronary artery disease)   . Chronic back pain   . Constipation, unspecified 06/09/2009  . CVA, old, alterations of sensations   . Degeneration of cervical intervertebral disc   . Degeneration of lumbar or lumbosacral intervertebral disc 06/10/2007  . Depression   . Diverticulitis of colon without hemorrhage   . Dyslipidemia   . Epidural abscess   . GERD (gastroesophageal reflux disease)   . Hypertension   . Spinal stenosis    HAD SURGERY    Past  Surgical History:  Procedure Laterality Date  . BACK SURGERY    . LAMINECTOMY  2009   OF C6-C7  . LUMBAR DISC SURGERY    . MOUTH SURGERY      Current Medications: Outpatient Medications Prior to Visit  Medication Sig Dispense Refill  . aspirin EC 81 MG tablet Take 81 mg by mouth daily.    Marland Kitchen atenolol (TENORMIN) 25 MG tablet Take 37.5 mg by mouth 2 (two) times daily. PT TAKES 75 MG     . buprenorphine (BUTRANS - DOSED MCG/HR) 10 MCG/HR PTWK patch Place 10 mcg onto the skin once a week.    . cyclobenzaprine (FLEXERIL) 10 MG tablet Take 10 mg by mouth 3 (three) times daily as needed for muscle spasms.    . DULoxetine (CYMBALTA) 20 MG capsule Take 20 mg by mouth 2 (two) times daily.    Marland Kitchen Fexofenadine HCl (ALLEGRA PO) Take 1 tablet by mouth as directed.    Marland Kitchen HYDROcodone-acetaminophen (NORCO) 10-325 MG tablet Take 1 tablet by mouth every 6 (six) hours as needed.    . mometasone (NASONEX) 50 MCG/ACT nasal spray Place 2 sprays into the nose daily.    . nitroGLYCERIN (NITROSTAT) 0.4 MG SL tablet Place 0.4 mg under the tongue every 5 (five) minutes as needed for chest pain.    Marland Kitchen OVER THE COUNTER MEDICATION otc tabqua    . polyethylene glycol powder (MIRALAX) powder Take 1 Container by mouth once.    . rosuvastatin (CRESTOR) 40  MG tablet Take 40 mg by mouth daily.    Marland Kitchen losartan (COZAAR) 25 MG tablet Take 1 tablet (25 mg total) by mouth daily. 30 tablet 6  . esomeprazole (NEXIUM) 40 MG capsule Take 40 mg by mouth daily at 12 noon.     No facility-administered medications prior to visit.      Allergies:   Codeine; Lidoderm [lidocaine]; and Paxil [paroxetine hcl]   Social History   Social History  . Marital status: Married    Spouse name: N/A  . Number of children: N/A  . Years of education: N/A   Social History Main Topics  . Smoking status: Never Smoker  . Smokeless tobacco: Never Used  . Alcohol use None  . Drug use: Unknown  . Sexual activity: Not Asked   Other Topics Concern  .  None   Social History Narrative  . None     Family History:  The patient's No early family history of CAD.   ROS:   Please see the history of present illness.    ROS All other systems reviewed and are negative.   PHYSICAL EXAM:   VS:  BP 108/62   Pulse (!) 54   Ht 5\' 7"  (1.702 m)   Wt 165 lb (74.8 kg)   BMI 25.84 kg/m    GEN: Well nourished, well developed, in no acute distress  HEENT: normal  Neck: no JVD, carotid bruits, or masses Cardiac: RRR; no murmurs, rubs, or gallops,no edema , pain medication patch noted Respiratory:  clear to auscultation bilaterally, normal work of breathing GI: soft, nontender, nondistended, + BS MS: no deformity or atrophy  Skin: warm and dry, no rash Neuro:  Alert and Oriented x 3, Strength and sensation are intact Psych: euthymic mood, full affect   Wt Readings from Last 3 Encounters:  03/14/17 165 lb (74.8 kg)  09/19/16 169 lb (76.7 kg)      Studies/Labs Reviewed:   EKG:  EKG is ordered today.  The ekg ordered today demonstrates 09/19/16-sinus rhythm, first-degree AV block, 62 bpm with left anterior fascicular block, right bundle branch block, bifascicular block  Recent Labs: No results found for requested labs within last 8760 hours.   Lipid Panel No results found for: CHOL, TRIG, HDL, CHOLHDL, VLDL, LDLCALC, LDLDIRECT  Additional studies/ records that were reviewed today include:  Prior office notes reviewed, lab work reviewed, EKG reviewed, cardiac catheterization reviewed.    ASSESSMENT:    1. Coronary artery disease involving native coronary artery of native heart without angina pectoris   2. Essential hypertension   3. Pure hypercholesterolemia   4. Right bundle branch block   5. Left anterior fascicular block   6. Bifascicular block      PLAN:  In order of problems listed above:  Coronary artery disease  - Vision, bare-metal stent to LAD - previously had 3 stents in 2002  - 100% left circumflex occlusion,  60-70% RCA occlusion, normal ejection fraction. There was haziness described in mid LAD stent previously. Last catheterization 04/2008  - Not feeling any anginal symptoms. Doing well.  Hyperlipidemia  - LDL goal less than 70. Continue with statin therapy. Low HDL noted. Cholesterol levels and blood pressure were previously being followed by his primary care physician. Lab work reviewed.  Hypertensive heart disease  - losartan 25 mg once a day. Atenolol. Seems to doing quite well.  Right bundle branch block/left anterior fascicular block  - Reviewed on EKG. No clinical indication for pacemaker. Conduction disease  noted. We once again discussed. Since he has been stable, I will not make any adjustments to his atenolol. Obviously bradycardia does occur, we will need to discontinue this medication.      Medication Adjustments/Labs and Tests Ordered: Current medicines are reviewed at length with the patient today.  Concerns regarding medicines are outlined above.  Medication changes, Labs and Tests ordered today are listed in the Patient Instructions below. Patient Instructions  Medication Instructions:  The current medical regimen is effective;  continue present plan and medications.  Follow-Up: Follow up in 6 months with Dr. Marlou Porch.  You will receive a letter in the mail 2 months before you are due.  Please call us when you receive this letter to schedule your follow up appointment.  If you need a refill on your cardiac medications before your next appointment, please call your pharmacy.  Thank you for choosing [facility]!!        Signed, Candee Furbish, MD  03/14/2017 3:03 PM    [facility] [place], [place],   [address] Phone: [phone]; Fax: [phone]

## 2017-03-19 ENCOUNTER — Other Ambulatory Visit: Payer: Self-pay | Admitting: Cardiology

## 2017-04-10 DIAGNOSIS — I10 Essential (primary) hypertension: Secondary | ICD-10-CM | POA: Diagnosis not present

## 2017-04-10 DIAGNOSIS — M533 Sacrococcygeal disorders, not elsewhere classified: Secondary | ICD-10-CM | POA: Diagnosis not present

## 2017-04-10 DIAGNOSIS — M5416 Radiculopathy, lumbar region: Secondary | ICD-10-CM | POA: Diagnosis not present

## 2017-04-10 DIAGNOSIS — M545 Low back pain: Secondary | ICD-10-CM | POA: Diagnosis not present

## 2017-04-16 DIAGNOSIS — K219 Gastro-esophageal reflux disease without esophagitis: Secondary | ICD-10-CM | POA: Diagnosis not present

## 2017-05-01 DIAGNOSIS — Z23 Encounter for immunization: Secondary | ICD-10-CM | POA: Diagnosis not present

## 2017-05-01 DIAGNOSIS — S61210A Laceration without foreign body of right index finger without damage to nail, initial encounter: Secondary | ICD-10-CM | POA: Diagnosis not present

## 2017-05-02 DIAGNOSIS — C44622 Squamous cell carcinoma of skin of right upper limb, including shoulder: Secondary | ICD-10-CM | POA: Diagnosis not present

## 2017-05-02 DIAGNOSIS — Z85828 Personal history of other malignant neoplasm of skin: Secondary | ICD-10-CM | POA: Diagnosis not present

## 2017-05-02 DIAGNOSIS — D485 Neoplasm of uncertain behavior of skin: Secondary | ICD-10-CM | POA: Diagnosis not present

## 2017-05-11 DIAGNOSIS — Z23 Encounter for immunization: Secondary | ICD-10-CM | POA: Diagnosis not present

## 2017-05-30 DIAGNOSIS — M5416 Radiculopathy, lumbar region: Secondary | ICD-10-CM | POA: Diagnosis not present

## 2017-05-30 DIAGNOSIS — M533 Sacrococcygeal disorders, not elsewhere classified: Secondary | ICD-10-CM | POA: Diagnosis not present

## 2017-06-27 DIAGNOSIS — M533 Sacrococcygeal disorders, not elsewhere classified: Secondary | ICD-10-CM | POA: Diagnosis not present

## 2017-06-27 DIAGNOSIS — M5416 Radiculopathy, lumbar region: Secondary | ICD-10-CM | POA: Diagnosis not present

## 2017-06-27 DIAGNOSIS — M545 Low back pain: Secondary | ICD-10-CM | POA: Diagnosis not present

## 2017-07-03 DIAGNOSIS — Z85828 Personal history of other malignant neoplasm of skin: Secondary | ICD-10-CM | POA: Diagnosis not present

## 2017-07-03 DIAGNOSIS — D485 Neoplasm of uncertain behavior of skin: Secondary | ICD-10-CM | POA: Diagnosis not present

## 2017-07-03 DIAGNOSIS — C44622 Squamous cell carcinoma of skin of right upper limb, including shoulder: Secondary | ICD-10-CM | POA: Diagnosis not present

## 2017-07-03 DIAGNOSIS — L821 Other seborrheic keratosis: Secondary | ICD-10-CM | POA: Diagnosis not present

## 2017-07-03 DIAGNOSIS — L570 Actinic keratosis: Secondary | ICD-10-CM | POA: Diagnosis not present

## 2017-07-29 DIAGNOSIS — M545 Low back pain: Secondary | ICD-10-CM | POA: Diagnosis not present

## 2017-07-29 DIAGNOSIS — M533 Sacrococcygeal disorders, not elsewhere classified: Secondary | ICD-10-CM | POA: Diagnosis not present

## 2017-07-29 DIAGNOSIS — M961 Postlaminectomy syndrome, not elsewhere classified: Secondary | ICD-10-CM | POA: Diagnosis not present

## 2017-08-21 DIAGNOSIS — Z85828 Personal history of other malignant neoplasm of skin: Secondary | ICD-10-CM | POA: Diagnosis not present

## 2017-08-21 DIAGNOSIS — D1801 Hemangioma of skin and subcutaneous tissue: Secondary | ICD-10-CM | POA: Diagnosis not present

## 2017-08-21 DIAGNOSIS — L821 Other seborrheic keratosis: Secondary | ICD-10-CM | POA: Diagnosis not present

## 2017-08-21 DIAGNOSIS — D692 Other nonthrombocytopenic purpura: Secondary | ICD-10-CM | POA: Diagnosis not present

## 2017-08-28 DIAGNOSIS — M533 Sacrococcygeal disorders, not elsewhere classified: Secondary | ICD-10-CM | POA: Diagnosis not present

## 2017-08-28 DIAGNOSIS — M961 Postlaminectomy syndrome, not elsewhere classified: Secondary | ICD-10-CM | POA: Diagnosis not present

## 2017-08-28 DIAGNOSIS — M545 Low back pain: Secondary | ICD-10-CM | POA: Diagnosis not present

## 2017-09-03 DIAGNOSIS — K219 Gastro-esophageal reflux disease without esophagitis: Secondary | ICD-10-CM | POA: Diagnosis not present

## 2017-09-10 ENCOUNTER — Ambulatory Visit (INDEPENDENT_AMBULATORY_CARE_PROVIDER_SITE_OTHER): Payer: Medicare Other | Admitting: Cardiology

## 2017-09-10 ENCOUNTER — Encounter: Payer: Self-pay | Admitting: Cardiology

## 2017-09-10 VITALS — BP 128/70 | HR 52 | Ht 68.0 in | Wt 163.1 lb

## 2017-09-10 DIAGNOSIS — I10 Essential (primary) hypertension: Secondary | ICD-10-CM

## 2017-09-10 DIAGNOSIS — I444 Left anterior fascicular block: Secondary | ICD-10-CM | POA: Diagnosis not present

## 2017-09-10 DIAGNOSIS — E7800 Pure hypercholesterolemia, unspecified: Secondary | ICD-10-CM

## 2017-09-10 DIAGNOSIS — I452 Bifascicular block: Secondary | ICD-10-CM

## 2017-09-10 DIAGNOSIS — I2510 Atherosclerotic heart disease of native coronary artery without angina pectoris: Secondary | ICD-10-CM

## 2017-09-10 DIAGNOSIS — I4510 Unspecified right bundle-branch block: Secondary | ICD-10-CM | POA: Diagnosis not present

## 2017-09-10 MED ORDER — CLOPIDOGREL BISULFATE 75 MG PO TABS: 75.0000 mg | ORAL_TABLET | Freq: Every day | ORAL | 3 refills | Status: DC

## 2017-09-10 MED ORDER — ROSUVASTATIN CALCIUM 40 MG PO TABS: 40.0000 mg | ORAL_TABLET | Freq: Every day | ORAL | 3 refills | Status: AC

## 2017-09-10 MED ORDER — LOSARTAN POTASSIUM 25 MG PO TABS: 25.0000 mg | ORAL_TABLET | Freq: Every day | ORAL | 3 refills | Status: AC

## 2017-09-10 MED ORDER — ATENOLOL 25 MG PO TABS: 37.5000 mg | ORAL_TABLET | Freq: Two times a day (BID) | ORAL | 3 refills | Status: DC

## 2017-09-10 NOTE — Patient Instructions (Signed)

## 2017-09-10 NOTE — Progress Notes (Signed)
Cardiology Office Note    Date:  09/10/2017   ID:  Christopher Ferguson, DOB 1936-02-07, MRN [id number]  PCP:  Dineen Kid, MD  Cardiologist:   Candee Furbish, MD     History of Present Illness:  Christopher Ferguson is a 82 y.o. male here to establish care with coronary artery disease, 2001 stents, bare-metal stent to LAD in 2009, dyslipidemia, essential hypertension. No significant anginal pain. Has had back pain, musculoskeletal. Has mild chronic shortness of breath as well as deconditioning. No orthopnea, no palpitations, no syncope. Blood pressure has been followed by PCP.  4 stents in total 3 in 2001 in setting of NSTEMI, 1 stent placed 2009 secondary to angina.  Retired Company secretary  He has been on aspirin and Plavix since 2001. He also has been on atenolol for quite some time. No significant bradycardia noted.  Cardiology overview per Dr. Kenton Kingfisher:  - cath 9/09: no change 2/09: stent-pat-LAD, haziness in mLAD stent. Ectatic RCA,100Lcx - Cardiolite 3/09: negative  -cath-09/27/07: 60-70RCA, 100Lcx, 75%LAD, EF55 - Vision 2.5x18->LAD  Prior radical prostatectomy in 1997. GERD. Prior thoracic back surgery of the C5-7. Lumbar back surgery as well.  03/14/17-overall he is doing well. Has chronic back pain, takes medication for this as well as injections every 3 months. No syncopal, no chest pain. Has a bifascicular block and has not had any recent issues. Has been on atenolol for many years.  09/10/17-overall doing quite well, active.  Has trouble getting up sometimes because of his back pain.  No anginal symptoms, no chest pain, no shortness of breath, no bleeding syncope orthopnea PND.  Compliant with medications.  Past Medical History:  Diagnosis Date  . AR (allergic rhinitis)   . CAD (coronary artery disease)   . Chronic back pain   . Constipation, unspecified 06/09/2009  . CVA, old, alterations of sensations   . Degeneration of cervical intervertebral disc   . Degeneration of lumbar or lumbosacral  intervertebral disc 06/10/2007  . Depression   . Diverticulitis of colon without hemorrhage   . Dyslipidemia   . Epidural abscess   . GERD (gastroesophageal reflux disease)   . Hypertension   . Spinal stenosis    HAD SURGERY      Current Medications: Outpatient Medications Prior to Visit  Medication Sig Dispense Refill  . Ascorbic Acid (VITAMIN C) 1000 MG tablet Take 1,000 mg by mouth daily.    Marland Kitchen aspirin EC 81 MG tablet Take 81 mg by mouth daily.    . Buprenorphine 15 MCG/HR PTWK Place 15 mcg onto the skin every 7 (seven) days.    . Ca Carbonate-Mag Hydroxide (ROLAIDS PO) Take 1 tablet by mouth daily as needed. (for heartburn)    . Cholecalciferol (VITAMIN D3) 10000 units TABS Take 1,000 Units by mouth daily.    . Coenzyme Q10 (COQ10) 200 MG CAPS Take 200 mg by mouth daily.    Marland Kitchen dexlansoprazole (DEXILANT) 60 MG capsule Take 60 mg by mouth daily.    Marland Kitchen dextromethorphan-guaiFENesin (MUCINEX DM) 30-600 MG 12hr tablet Take 1 tablet by mouth every 12 (twelve) hours as needed for congestion.    . DULoxetine (CYMBALTA) 20 MG capsule Take 10 mg by mouth daily.    Marland Kitchen Fexofenadine HCl (ALLEGRA PO) Take 1 tablet by mouth as directed.    . Flaxseed, Linseed, (FLAXSEED OIL) 1000 MG CAPS Take 1,000 mg by mouth daily.    Marland Kitchen HYDROcodone-acetaminophen (NORCO) 10-325 MG tablet Take 1 tablet by mouth every 6 (six) hours as needed.    Marland Kitchen  LYCOPENE PO Take 1 tablet by mouth daily.    . mometasone (NASONEX) 50 MCG/ACT nasal spray Place 2 sprays into the nose daily.    . nitroGLYCERIN (NITROSTAT) 0.4 MG SL tablet Place 0.4 mg under the tongue every 5 (five) minutes as needed for chest pain.    . Omega-3 Fatty Acids (FISH OIL PO) Take 1 capsule by mouth daily.    Marland Kitchen OVER THE COUNTER MEDICATION Take 1 capsule by mouth 2 (two) times daily. OMEAGA XL    . polyethylene glycol powder (MIRALAX) powder Take 1 Container by mouth once.    Marland Kitchen tiZANidine (ZANAFLEX) 2 MG tablet Take 2 mg by mouth every 8 (eight) hours.  2    . atenolol (TENORMIN) 25 MG tablet Take 37.5 mg by mouth 2 (two) times daily. PT TAKES 75 MG     . clopidogrel (PLAVIX) 75 MG tablet Take 75 mg by mouth daily.    Marland Kitchen losartan (COZAAR) 25 MG tablet TAKE 1 TABLET BY MOUTH EVERY DAY 30 tablet 11  . rosuvastatin (CRESTOR) 40 MG tablet Take 40 mg by mouth daily.    . buprenorphine (BUTRANS - DOSED MCG/HR) 10 MCG/HR PTWK patch Place 10 mcg onto the skin once a week.    . cyclobenzaprine (FLEXERIL) 10 MG tablet Take 10 mg by mouth 3 (three) times daily as needed for muscle spasms.    . DULoxetine (CYMBALTA) 20 MG capsule Take 20 mg by mouth 2 (two) times daily.    Marland Kitchen OVER THE COUNTER MEDICATION otc tabqua     No facility-administered medications prior to visit.      Allergies:   Codeine; Lidoderm [lidocaine]; and Paxil [paroxetine hcl]   Social History   Socioeconomic History  . Marital status: Married    Spouse name: None  . Number of children: None  . Years of education: None  . Highest education level: None  Social Needs  . Financial resource strain: None  . Food insecurity - worry: None  . Food insecurity - inability: None  . Transportation needs - medical: None  . Transportation needs - non-medical: None  Occupational History  . None  Tobacco Use  . Smoking status: Never Smoker  . Smokeless tobacco: Never Used  Substance and Sexual Activity  . Alcohol use: None  . Drug use: None  . Sexual activity: None  Other Topics Concern  . None  Social History Narrative  . None     Family History:  The patient's No early family history of CAD.   ROS:   Please see the history of present illness.    Review of Systems  All other systems reviewed and are negative.     PHYSICAL EXAM:   VS:  BP 128/70   Pulse (!) 52   Ht 5\' 8"  (1.727 m)   Wt 163 lb 1.9 oz (74 kg)   BMI 24.80 kg/m    GEN: Well nourished, well developed, in no acute distress  HEENT: normal  Neck: no JVD, carotid bruits, or masses Cardiac: RRR; no murmurs,  rubs, or gallops,no edema  Respiratory:  clear to auscultation bilaterally, normal work of breathing GI: soft, nontender, nondistended, + BS MS: no deformity or atrophy  Skin: warm and dry, no rash Neuro:  Alert and Oriented x 3, Strength and sensation are intact Psych: euthymic mood, full affect    Wt Readings from Last 3 Encounters:  09/10/17 163 lb 1.9 oz (74 kg)  03/14/17 165 lb (74.8 kg)  09/19/16 169  lb (76.7 kg)      Studies/Labs Reviewed:   EKG:  EKG is ordered today.  The ekg ordered today demonstrates 09/19/16-sinus rhythm, first-degree AV block, 62 bpm with left anterior fascicular block, right bundle branch block, bifascicular block  Recent Labs: No results found for requested labs within last 8760 hours.   Lipid Panel No results found for: CHOL, TRIG, HDL, CHOLHDL, VLDL, LDLCALC, LDLDIRECT  Additional studies/ records that were reviewed today include:  Prior office notes reviewed, lab work reviewed, EKG reviewed, cardiac catheterization reviewed.    ASSESSMENT:    1. Coronary artery disease involving native coronary artery of native heart without angina pectoris   2. Pure hypercholesterolemia   3. Right bundle branch block   4. Left anterior fascicular block   5. Essential hypertension   6. Bifascicular block      PLAN:  In order of problems listed above:  Coronary artery disease  - Vision, bare-metal stent to LAD - previously had 3 stents in 2002  - 100% left circumflex occlusion, 60-70% RCA occlusion, normal ejection fraction. There was haziness described in mid LAD stent previously. Last catheterization 04/2008  - Not feeling any anginal symptoms. Doing well.  - ASA and Plavix. No changes.  Continuing with dual antiplatelet therapy in the setting of early generation stenting.  Continue aggressive secondary prevention.  Continue exercise.  Hyperlipidemia  - LDL goal less than 70.  At last check LDL was 56 in July 2018.  ALT 18.  Continue with statin  therapy. Low HDL noted. Cholesterol levels and blood pressure were previously being followed by his primary care physician. Lab work reviewed.  Hypertensive heart disease  - losartan 25 mg once a day. Atenolol. Seems to doing quite well.  Right bundle branch block/left anterior fascicular block  - Reviewed on EKG. No clinical indication for pacemaker. Conduction disease noted. We once again discussed. Since he has been stable, I will not make any adjustments to his atenolol. Obviously bradycardia does occur, we will need to discontinue this medication. No chsnge   Medication Adjustments/Labs and Tests Ordered: Current medicines are reviewed at length with the patient today.  Concerns regarding medicines are outlined above.  Medication changes, Labs and Tests ordered today are listed in the Patient Instructions below. Patient Instructions  Medication Instructions:  The current medical regimen is effective;  continue present plan and medications.  Follow-Up: Follow up in 1 year with Dr. Marlou Porch.  You will receive a letter in the mail 2 months before you are due.  Please call us when you receive this letter to schedule your follow up appointment.  If you need a refill on your cardiac medications before your next appointment, please call your pharmacy.  Thank you for choosing [facility]!!        Signed, Candee Furbish, MD  09/10/2017 12:08 PM    [address], [place], [place]  [address] Phone: [phone]; Fax: [phone]

## 2017-09-18 ENCOUNTER — Telehealth: Payer: Self-pay | Admitting: Cardiology

## 2017-09-18 DIAGNOSIS — Z0000 Encounter for general adult medical examination without abnormal findings: Secondary | ICD-10-CM | POA: Diagnosis not present

## 2017-09-18 DIAGNOSIS — Z125 Encounter for screening for malignant neoplasm of prostate: Secondary | ICD-10-CM | POA: Diagnosis not present

## 2017-09-18 DIAGNOSIS — M549 Dorsalgia, unspecified: Secondary | ICD-10-CM | POA: Diagnosis not present

## 2017-09-18 DIAGNOSIS — J309 Allergic rhinitis, unspecified: Secondary | ICD-10-CM | POA: Diagnosis not present

## 2017-09-18 DIAGNOSIS — K219 Gastro-esophageal reflux disease without esophagitis: Secondary | ICD-10-CM | POA: Diagnosis not present

## 2017-09-18 DIAGNOSIS — I2510 Atherosclerotic heart disease of native coronary artery without angina pectoris: Secondary | ICD-10-CM | POA: Diagnosis not present

## 2017-09-18 DIAGNOSIS — Z9079 Acquired absence of other genital organ(s): Secondary | ICD-10-CM | POA: Diagnosis not present

## 2017-09-18 DIAGNOSIS — I10 Essential (primary) hypertension: Secondary | ICD-10-CM | POA: Diagnosis not present

## 2017-09-18 NOTE — Telephone Encounter (Signed)
Mr.State is calling to find out how often should he have lab work done and if he should come to Dr. Marlou Porch to have it done or his PCP . Please Call

## 2017-09-18 NOTE — Telephone Encounter (Signed)
Spoke with patient who is aware Dr Marlou Porch did not order any lab work and he should have any lab work needed at his PCP office.  The lab ordered and frequency of it would be determine by his PCP.  Pt states understanding.

## 2017-10-01 ENCOUNTER — Telehealth: Payer: Self-pay | Admitting: Cardiology

## 2017-10-01 NOTE — Telephone Encounter (Signed)
New message    Pt c/o medication issue:  1. Name of Medication: Losartan and Atenolol  2. How are you currently taking this medication (dosage and times per day)? As prescribed  3. Are you having a reaction (difficulty breathing--STAT)? No  4. What is your medication issue? Clarification needed, can both medications be taken together. Also needs to verify dosage

## 2017-10-01 NOTE — Telephone Encounter (Signed)
Informed pt that he is to be taking both Atenolol and Losartan.  Pt had question about dose of Atenolol.  Advised per our records he is to be taking 37.5mg  BID.  Pt states what he received from [place] was Atenolol 50mg - take 1.5 tabs twice daily.  Pt states his BP is normal when he goes to appointments but otherwise doesn't check it.  Pt would like to know which dose of Atenolol Dr. Marlou Porch wants him to take?

## 2017-10-02 NOTE — Telephone Encounter (Signed)
.   atenolol (TENORMIN) 25 MG tablet Take 37.5 mg by mouth 2 (two) times daily. PT TAKES 75 MG

## 2017-10-02 NOTE — Telephone Encounter (Signed)
Called and spoke with pt RE: Atenolol dose.  When he was seen 09/05/2017 by Dr Marlou Porch patient reported taking 25 mg tablets - 1 and 1/2 tablet twice a day (37.5 mg BID) Pt is now reporting he received 50 mg tablets from his pharmacy and is questioning how much medication he is to take. [facility] who reports they have only filled Atenolol one time for the pt and it was for 50 mg tablets (to take 1 and 1/2 tab BID)on 09/06/2017 by Dr Lennette Bihari Via.   While speaking with the patient he admits he may have reported the amount of medication he was taking incorrectly and wants to know how much Dr Marlou Porch wants him to take.  Advised the patient per documentation for the office visit on 1/24 he is to continue medications as listed and no changes needed to be made at that time. Informed pt Dr Marlou Porch felt the amount of Atenolol he was taking at that time was the correct dosage as it was controlling his HR and BP appropriately.  Instructed pt to continue on the medication he was taking at the time of the appointment.  Pt became very frustrated and stated "You are not my Dr and can't tell me how much medicine I should be taking"  And "I only want to speak to him about it".  I advised the patient there would be no reason to change his medication as there has not been any change in his health (HR/BP) and he should continue to take his Atenolol as he was taking at the time of his last appointment.  Pt demands to speak only with Dr Marlou Porch about the issue so that he can answer his question.  Informed pt I will forward this information to Dr Marlou Porch for review and recommendation however ultimately before any decision can be made about his dose we do need to know how much he is actually taking.

## 2017-10-03 NOTE — Telephone Encounter (Signed)
Information reviewed with pt who states understanding to continue his current dose of Atenolol (50 mg 1.5 tabs po BID).  Medication list corrected.

## 2017-10-03 NOTE — Telephone Encounter (Signed)
50mg  PO 1.5 tab BID. (75mg  PO BID total) I want to keep him on the same dose he was taking before.   Thanks.   Candee Furbish, MD

## 2017-11-04 DIAGNOSIS — M533 Sacrococcygeal disorders, not elsewhere classified: Secondary | ICD-10-CM | POA: Diagnosis not present

## 2017-11-04 DIAGNOSIS — M961 Postlaminectomy syndrome, not elsewhere classified: Secondary | ICD-10-CM | POA: Diagnosis not present

## 2017-11-04 DIAGNOSIS — M545 Low back pain: Secondary | ICD-10-CM | POA: Diagnosis not present

## 2017-12-03 DIAGNOSIS — M533 Sacrococcygeal disorders, not elsewhere classified: Secondary | ICD-10-CM | POA: Diagnosis not present

## 2017-12-30 DIAGNOSIS — Z1159 Encounter for screening for other viral diseases: Secondary | ICD-10-CM | POA: Diagnosis not present

## 2017-12-30 DIAGNOSIS — M549 Dorsalgia, unspecified: Secondary | ICD-10-CM | POA: Diagnosis not present

## 2017-12-30 DIAGNOSIS — D489 Neoplasm of uncertain behavior, unspecified: Secondary | ICD-10-CM | POA: Diagnosis not present

## 2018-01-13 DIAGNOSIS — Z85828 Personal history of other malignant neoplasm of skin: Secondary | ICD-10-CM | POA: Diagnosis not present

## 2018-01-13 DIAGNOSIS — L570 Actinic keratosis: Secondary | ICD-10-CM | POA: Diagnosis not present

## 2018-01-13 DIAGNOSIS — L821 Other seborrheic keratosis: Secondary | ICD-10-CM | POA: Diagnosis not present

## 2018-01-13 DIAGNOSIS — L820 Inflamed seborrheic keratosis: Secondary | ICD-10-CM | POA: Diagnosis not present

## 2018-01-20 DIAGNOSIS — H43393 Other vitreous opacities, bilateral: Secondary | ICD-10-CM | POA: Diagnosis not present

## 2018-01-20 DIAGNOSIS — Z961 Presence of intraocular lens: Secondary | ICD-10-CM | POA: Diagnosis not present

## 2018-02-04 DIAGNOSIS — M961 Postlaminectomy syndrome, not elsewhere classified: Secondary | ICD-10-CM | POA: Diagnosis not present

## 2018-02-04 DIAGNOSIS — M545 Low back pain: Secondary | ICD-10-CM | POA: Diagnosis not present

## 2018-02-18 DIAGNOSIS — Z85828 Personal history of other malignant neoplasm of skin: Secondary | ICD-10-CM | POA: Diagnosis not present

## 2018-02-18 DIAGNOSIS — L570 Actinic keratosis: Secondary | ICD-10-CM | POA: Diagnosis not present

## 2018-02-18 DIAGNOSIS — L723 Sebaceous cyst: Secondary | ICD-10-CM | POA: Diagnosis not present

## 2018-03-06 DIAGNOSIS — M533 Sacrococcygeal disorders, not elsewhere classified: Secondary | ICD-10-CM | POA: Diagnosis not present

## 2018-03-06 DIAGNOSIS — I10 Essential (primary) hypertension: Secondary | ICD-10-CM | POA: Diagnosis not present

## 2018-03-06 DIAGNOSIS — Z6825 Body mass index (BMI) 25.0-25.9, adult: Secondary | ICD-10-CM | POA: Diagnosis not present

## 2018-03-06 DIAGNOSIS — M961 Postlaminectomy syndrome, not elsewhere classified: Secondary | ICD-10-CM | POA: Diagnosis not present

## 2018-03-07 ENCOUNTER — Telehealth: Payer: Self-pay

## 2018-03-07 NOTE — Telephone Encounter (Signed)
Dr Novella Rob to hold Plavix for 7 days for procedure? Please reply to CV DIV PRE OP  Zunaira Lamy PA-C 03/07/2018 4:25 PM

## 2018-03-07 NOTE — Telephone Encounter (Signed)
   [facility] Pre-operative Risk Assessment    Request for surgical clearance:  1. What type of surgery is being performed? Caudal ESI and Sacrococcygeal Injection   2. When is this surgery scheduled? 05/08/18  3. What type of clearance is required (medical clearance vs. Pharmacy clearance to hold med vs. Both)? Medical  4. Are there any medications that need to be held prior to surgery and how long? Plavix for 7 days   5. Practice name and name of physician performing surgery? [facility], Clydell Hakim, MD   6. What is your office phone number [phone]    7.   What is your office fax number [phone]  8.   Anesthesia type (None, local, MAC, general) ? Unspecific    Mary Sella Kordsmeier 03/07/2018, 1:24 PM  _________________________________________________________________   (provider comments below)

## 2018-03-10 ENCOUNTER — Encounter: Payer: Self-pay | Admitting: Cardiology

## 2018-03-10 NOTE — Telephone Encounter (Signed)
Yes Christopher Furbish, MD

## 2018-03-12 NOTE — Telephone Encounter (Signed)
   Primary Cardiologist:Mark Marlou Porch, MD  Chart reviewed as part of pre-operative protocol coverage. Pre-op clearance already addressed by colleagues in earlier phone notes. To summarize recommendations:  - per Dr. Marlou Porch, -OK to hold Plavix for 7 days for procedure. See below.   Will route this bundled recommendation to requesting provider via Epic fax function. Please call with questions.  Lyda Jester, PA-C 03/12/2018, 4:31 PM

## 2018-03-19 DIAGNOSIS — K219 Gastro-esophageal reflux disease without esophagitis: Secondary | ICD-10-CM | POA: Diagnosis not present

## 2018-03-19 DIAGNOSIS — M549 Dorsalgia, unspecified: Secondary | ICD-10-CM | POA: Diagnosis not present

## 2018-03-19 DIAGNOSIS — J309 Allergic rhinitis, unspecified: Secondary | ICD-10-CM | POA: Diagnosis not present

## 2018-03-19 DIAGNOSIS — I2510 Atherosclerotic heart disease of native coronary artery without angina pectoris: Secondary | ICD-10-CM | POA: Diagnosis not present

## 2018-03-19 DIAGNOSIS — I10 Essential (primary) hypertension: Secondary | ICD-10-CM | POA: Diagnosis not present

## 2018-04-02 ENCOUNTER — Telehealth: Payer: Self-pay | Admitting: Cardiology

## 2018-04-02 NOTE — Telephone Encounter (Signed)
Spoke with patient who is concerned holding his Plavix prior to back injections gives him headaches.  These h/a occur in the top of his head and at his temples.  They occur after the back injection.  Advised pt the h/a is from the back injection and not from holding Plavix.  He is asking if he can hold the Plavix a shorter amount of time.  Advised MD doing the injection would be who would need to determine the amount of time he should hold the Plavix.  Of note - in the past, pt was instructed to d/c Plavix however pt decided he wanted to continue to take it.    Pt thanked me for answering his questions and he will contact Dr Maryjean Ka.

## 2018-04-02 NOTE — Telephone Encounter (Signed)
New Message  Pt c/o medication issue:  1. Name of Medication: clopidogrel (PLAVIX) 75 MG tablet  2. How are you currently taking this medication (dosage and times per day)? Take 1 tablet (75 mg total) by mouth daily  3. Are you having a reaction (difficulty breathing--STAT)? no  4. What is your medication issue? Pt states that whenever he holds his plavix for a week it always causes him to have an intense headache and wanted to know what his options are. See clearance notes in epic

## 2018-04-09 DIAGNOSIS — D485 Neoplasm of uncertain behavior of skin: Secondary | ICD-10-CM | POA: Diagnosis not present

## 2018-04-09 DIAGNOSIS — D1801 Hemangioma of skin and subcutaneous tissue: Secondary | ICD-10-CM | POA: Diagnosis not present

## 2018-04-09 DIAGNOSIS — L821 Other seborrheic keratosis: Secondary | ICD-10-CM | POA: Diagnosis not present

## 2018-04-09 DIAGNOSIS — L570 Actinic keratosis: Secondary | ICD-10-CM | POA: Diagnosis not present

## 2018-04-09 DIAGNOSIS — L820 Inflamed seborrheic keratosis: Secondary | ICD-10-CM | POA: Diagnosis not present

## 2018-04-09 DIAGNOSIS — Z85828 Personal history of other malignant neoplasm of skin: Secondary | ICD-10-CM | POA: Diagnosis not present

## 2018-05-08 DIAGNOSIS — M961 Postlaminectomy syndrome, not elsewhere classified: Secondary | ICD-10-CM | POA: Diagnosis not present

## 2018-05-08 DIAGNOSIS — M533 Sacrococcygeal disorders, not elsewhere classified: Secondary | ICD-10-CM | POA: Diagnosis not present

## 2018-05-08 DIAGNOSIS — M545 Low back pain: Secondary | ICD-10-CM | POA: Diagnosis not present

## 2018-05-16 DIAGNOSIS — Z23 Encounter for immunization: Secondary | ICD-10-CM | POA: Diagnosis not present

## 2018-06-30 DIAGNOSIS — L820 Inflamed seborrheic keratosis: Secondary | ICD-10-CM | POA: Diagnosis not present

## 2018-06-30 DIAGNOSIS — L821 Other seborrheic keratosis: Secondary | ICD-10-CM | POA: Diagnosis not present

## 2018-06-30 DIAGNOSIS — D692 Other nonthrombocytopenic purpura: Secondary | ICD-10-CM | POA: Diagnosis not present

## 2018-06-30 DIAGNOSIS — Z85828 Personal history of other malignant neoplasm of skin: Secondary | ICD-10-CM | POA: Diagnosis not present

## 2018-07-03 DIAGNOSIS — Z6825 Body mass index (BMI) 25.0-25.9, adult: Secondary | ICD-10-CM | POA: Diagnosis not present

## 2018-07-03 DIAGNOSIS — M961 Postlaminectomy syndrome, not elsewhere classified: Secondary | ICD-10-CM | POA: Diagnosis not present

## 2018-07-03 DIAGNOSIS — Z79891 Long term (current) use of opiate analgesic: Secondary | ICD-10-CM | POA: Diagnosis not present

## 2018-07-03 DIAGNOSIS — I10 Essential (primary) hypertension: Secondary | ICD-10-CM | POA: Diagnosis not present

## 2018-07-08 DIAGNOSIS — K219 Gastro-esophageal reflux disease without esophagitis: Secondary | ICD-10-CM | POA: Diagnosis not present

## 2018-07-14 DIAGNOSIS — K219 Gastro-esophageal reflux disease without esophagitis: Secondary | ICD-10-CM | POA: Diagnosis not present

## 2018-07-24 DIAGNOSIS — D649 Anemia, unspecified: Secondary | ICD-10-CM | POA: Diagnosis not present

## 2018-07-24 DIAGNOSIS — R1013 Epigastric pain: Secondary | ICD-10-CM | POA: Diagnosis not present

## 2018-07-24 DIAGNOSIS — K219 Gastro-esophageal reflux disease without esophagitis: Secondary | ICD-10-CM | POA: Diagnosis not present

## 2018-07-24 DIAGNOSIS — Z800 Family history of malignant neoplasm of digestive organs: Secondary | ICD-10-CM | POA: Diagnosis not present

## 2018-07-28 ENCOUNTER — Telehealth: Payer: Self-pay

## 2018-07-28 NOTE — Telephone Encounter (Signed)
Left detailed message that pt needs an appt for surgical clearance to be done and to call the office to get that arranged.

## 2018-07-28 NOTE — Telephone Encounter (Signed)
   [facility] Pre-operative Risk Assessment    Request for surgical clearance:  1. What type of surgery is being performed?  Endoscopy   2. When is this surgery scheduled?  08/14/18   3. What type of clearance is required (medical clearance vs. Pharmacy clearance to hold med vs. Both)? both  4. Are there any medications that need to be held prior to surgery and how long? Plavix    5. Practice name and name of physician performing surgery? [facility]/ Dr Earlean Shawl   6. What is your office phone number [phone]    7.   What is your office fax number [phone]  8.   Anesthesia type (None, local, MAC, general) ?  MAC   Christopher Ferguson 07/28/2018, 2:38 PM  _________________________________________________________________   (provider comments below)

## 2018-07-28 NOTE — Telephone Encounter (Signed)
   Primary Cardiologist:Mark Marlou Porch, MD  Chart reviewed as part of pre-operative protocol coverage. Because of Christopher Ferguson's past medical history and time since last visit, he/she will require a follow-up visit in order to better assess preoperative cardiovascular risk.  Pre-op covering staff: - Please schedule appointment and call patient to inform them. - Please contact requesting surgeon's office via preferred method (i.e, phone, fax) to inform them of need for appointment prior to surgery.  If applicable, this message will also be routed to pharmacy pool and/or primary cardiologist for input on holding anticoagulant/antiplatelet agent as requested below so that this information is available at time of patient's appointment.   Cecilie Kicks, NP  07/28/2018, 3:31 PM

## 2018-07-29 NOTE — Telephone Encounter (Signed)
1st attempt: Left vm for patient to call back and schedule appt for clearance

## 2018-07-31 DIAGNOSIS — M533 Sacrococcygeal disorders, not elsewhere classified: Secondary | ICD-10-CM | POA: Diagnosis not present

## 2018-07-31 DIAGNOSIS — M961 Postlaminectomy syndrome, not elsewhere classified: Secondary | ICD-10-CM | POA: Diagnosis not present

## 2018-07-31 NOTE — Telephone Encounter (Signed)
3rd attempt to reach pt to scheduled him for a sooner ov. I will remove from preop pool,.

## 2018-08-07 DIAGNOSIS — M545 Low back pain: Secondary | ICD-10-CM | POA: Diagnosis not present

## 2018-08-07 DIAGNOSIS — M21372 Foot drop, left foot: Secondary | ICD-10-CM | POA: Diagnosis not present

## 2018-08-07 DIAGNOSIS — M25559 Pain in unspecified hip: Secondary | ICD-10-CM | POA: Diagnosis not present

## 2018-08-07 DIAGNOSIS — M4807 Spinal stenosis, lumbosacral region: Secondary | ICD-10-CM | POA: Diagnosis not present

## 2018-08-08 ENCOUNTER — Telehealth: Payer: Self-pay | Admitting: Cardiology

## 2018-08-08 ENCOUNTER — Telehealth: Payer: Self-pay

## 2018-08-08 NOTE — Telephone Encounter (Signed)
New Message:   Patient calling concerning his medical clearance. He states no one called him. He would like to get a sooner appt and some medications to stop. Please call Patient back on his house phone. Patient said that he need to know if he need to keep taking. Patient stating he would like to speak with the Dr. Also.

## 2018-08-08 NOTE — Telephone Encounter (Signed)
   [facility] Pre-operative Risk Assessment    Request for surgical clearance:  1. What type of surgery is being performed? Endoscopy   2. When is this surgery scheduled?  08/14/18   3. What type of clearance is required (medical clearance vs. Pharmacy clearance to hold med vs. Both)? pharmacy  4. Are there any medications that need to be held prior to surgery and how long? plavix   5. Practice name and name of physician performing surgery? [facility]/ Dr Earlean Shawl   6. What is your office phone number  [phone]   7.   What is your office fax number [phone]  8.   Anesthesia type (None, local, MAC, general) ? Christopher Ferguson  Christopher Ferguson 08/08/2018, 9:55 AM  _________________________________________________________________   (provider comments below)

## 2018-08-08 NOTE — Telephone Encounter (Signed)
   Primary Cardiologist: Candee Furbish, MD  Chart reviewed as part of pre-operative protocol coverage. Patient was contacted 08/08/2018 in reference to pre-operative risk assessment for pending surgery as outlined below.  Christopher Ferguson was last seen on 09/10/17 by Dr. Marlou Porch.  Since that day, Christopher Ferguson has done well with no cardiac complaints.  Therefore, based on ACC/AHA guidelines, the patient would be at acceptable risk for the planned procedure without further cardiovascular testing. He does have an appointment for cardiac follow up on 08/12/18 with Richardson Dopp, PA. I anticipate that he will be OK for surgery so would keep his scheduled endoscopy appt. If otherwise, our office will contact you.   OK to hold plavix for 5-7 days as needed for procedure. Prefer to continue aspirin if possible. The patient states that he has been holding both aspirin and Plavix since 12/25 for procedure.   I will route this recommendation to the requesting party via Epic fax function and remove from pre-op pool.  Please call with questions.  Daune Perch, NP 08/08/2018, 3:38 PM

## 2018-08-08 NOTE — Telephone Encounter (Signed)
Spoke with patient to schedule appointment for clearance prior to his upcoming scheduled endoscopy.  Appt scheduled with Margaret Pyle on 12/31 at 11:45 am with instructions to pt to arrive at 11:30 for registration.  He states understanding. Pt goes on to state he has discontinued his Plavix and ASA on 12/25 in preparation for his endoscopy.  He did this of his own accord. He very rudely complained that "no one has called me from your office and now my procedure is going to be cancelled."  Advised pt there is documentation where 3 different messages were left for him to call the office to schedule an appt for clearance. (12/16 @ 3:49 pm, 12/17 @ 2:36 pm and 12/19 @ 4:36 pm)  Pt began speaking to me in a very loud and aggressive voice stating that no one has called him and left messages and he will be speaking to the doctor to "tell him he can either get his office under control or I will find a new one."   I asked the patient to please not yell at me as I am trying to help him to which he responded "Your help is insufficient ."   He also stated he didn't understand why the heart doctor has so much control over what his stomach dr is doing.  In an effort to not engage the pt any further due to his obvious agitated behavior, I then reminded patient of [place] appt date and time and advised I will forward this information to Dr Marlou Porch for his review.  I then disconnected the call.

## 2018-08-11 ENCOUNTER — Encounter: Payer: Self-pay | Admitting: Cardiology

## 2018-08-11 ENCOUNTER — Ambulatory Visit (INDEPENDENT_AMBULATORY_CARE_PROVIDER_SITE_OTHER): Payer: Medicare Other | Admitting: Cardiology

## 2018-08-11 VITALS — BP 158/62 | HR 50 | Ht 68.0 in | Wt 166.4 lb

## 2018-08-11 DIAGNOSIS — K219 Gastro-esophageal reflux disease without esophagitis: Secondary | ICD-10-CM | POA: Insufficient documentation

## 2018-08-11 DIAGNOSIS — Z01810 Encounter for preprocedural cardiovascular examination: Secondary | ICD-10-CM | POA: Insufficient documentation

## 2018-08-11 DIAGNOSIS — Z9861 Coronary angioplasty status: Secondary | ICD-10-CM

## 2018-08-11 DIAGNOSIS — M4800 Spinal stenosis, site unspecified: Secondary | ICD-10-CM | POA: Insufficient documentation

## 2018-08-11 DIAGNOSIS — I2510 Atherosclerotic heart disease of native coronary artery without angina pectoris: Secondary | ICD-10-CM | POA: Insufficient documentation

## 2018-08-11 DIAGNOSIS — E785 Hyperlipidemia, unspecified: Secondary | ICD-10-CM

## 2018-08-11 DIAGNOSIS — Z01818 Encounter for other preprocedural examination: Secondary | ICD-10-CM

## 2018-08-11 DIAGNOSIS — I10 Essential (primary) hypertension: Secondary | ICD-10-CM | POA: Diagnosis not present

## 2018-08-11 DIAGNOSIS — I452 Bifascicular block: Secondary | ICD-10-CM | POA: Insufficient documentation

## 2018-08-11 MED ORDER — ATENOLOL 50 MG PO TABS: 50.0000 mg | ORAL_TABLET | Freq: Two times a day (BID) | ORAL | 1 refills | Status: DC

## 2018-08-11 NOTE — Assessment & Plan Note (Signed)
Chronic- also noted to be bradycardic-HR 50

## 2018-08-11 NOTE — Assessment & Plan Note (Signed)
Controlled.  

## 2018-08-11 NOTE — Telephone Encounter (Signed)
Spoke to patient, agreed to come to appt today at 2 pm with Kerin Ransom PA.    He is aware this appt is at [facility], address and location given.   Patient verbalized understanding.

## 2018-08-11 NOTE — Telephone Encounter (Signed)
Call received to scheduling from Pt.  Pt has surgical clearance appt with SW tomorrow.  Per Pt he needs clearance sent to GI doctor today because GI doctor office is closed tomorrow and Pt states he is at risk of having his GI procedure cancelled.  Will send to pre op APP for review.

## 2018-08-11 NOTE — Telephone Encounter (Signed)
I'll see him at 2 pm- Hildred Alamin knows and will contact the patient.  Kerin Ransom PA-C 08/11/2018 12:00 PM

## 2018-08-11 NOTE — Progress Notes (Signed)
08/11/2018 Vinay Willig   01/03/1936  [id number]  Primary Physician Via, Lennette Bihari, MD Primary Cardiologist: Dr Marlou Porch  HPI: Mr. Christopher Ferguson is a pleasant 82 year old (his birthday is today) male followed by Dr. Marlou Porch.  Patient has a history of coronary disease, he had 3 stents placed in 2001, and one stent placed in 2009.  Apparently his catheterization in 2009 showed a chronic occlusion of the circumflex.  He has not required subsequent cardiac testing.  He last saw Dr. Marlou Porch in January 2019.  Patient is in the office today for preop clearance prior to an endoscopy.  He is done well since he saw Dr. Marlou Porch last.  He walks a couple of blocks a day for exercise and is able to go up and down stairs without problems.  His main limiting factor is back problems, he gets spinal injections 4 times a year.  Other medical problems include treated hypertension treated dyslipidemia and a chronic bifascicular block with a right bundle branch block and left anterior fascicular block.   Current Outpatient Medications  Medication Sig Dispense Refill  . Ascorbic Acid (VITAMIN C) 1000 MG tablet Take 1,000 mg by mouth daily.    Marland Kitchen aspirin EC 81 MG tablet Take 81 mg by mouth daily.    Marland Kitchen atenolol (TENORMIN) 50 MG tablet Take 1 tablet (50 mg total) by mouth 2 (two) times daily. 180 tablet 1  . buprenorphine (BUTRANS - DOSED MCG/HR) 20 MCG/HR PTWK patch Place 20 mcg onto the skin every 7 (seven) days.     . Ca Carbonate-Mag Hydroxide (ROLAIDS PO) Take 1 tablet by mouth daily as needed. (for heartburn)    . Cholecalciferol (VITAMIN D3) 10000 units TABS Take 1,000 Units by mouth daily.    . clopidogrel (PLAVIX) 75 MG tablet Take 1 tablet (75 mg total) by mouth daily. 90 tablet 3  . Coenzyme Q10 (COQ10) 200 MG CAPS Take 200 mg by mouth daily.    . cyclobenzaprine (FLEXERIL) 5 MG tablet Take 5 mg by mouth as directed.    Marland Kitchen dexlansoprazole (DEXILANT) 60 MG capsule Take 60 mg by mouth daily.    Marland Kitchen  dextromethorphan-guaiFENesin (MUCINEX DM) 30-600 MG 12hr tablet Take 1 tablet by mouth every 12 (twelve) hours as needed for congestion.    . DULoxetine (CYMBALTA) 20 MG capsule Take 10 mg by mouth daily.    . famotidine (PEPCID) 40 MG tablet Take 40 mg by mouth at bedtime.    Marland Kitchen Fexofenadine HCl (ALLEGRA PO) Take 1 tablet by mouth as directed.    . Flaxseed, Linseed, (FLAXSEED OIL) 1000 MG CAPS Take 1,000 mg by mouth daily.    Marland Kitchen HYDROcodone-acetaminophen (NORCO) 10-325 MG tablet Take 1 tablet by mouth every 6 (six) hours as needed.    Marland Kitchen losartan (COZAAR) 25 MG tablet Take 1 tablet (25 mg total) by mouth daily. 90 tablet 3  . LYCOPENE PO Take 1 tablet by mouth daily.    . mometasone (NASONEX) 50 MCG/ACT nasal spray Place 2 sprays into the nose daily.    . nitroGLYCERIN (NITROSTAT) 0.4 MG SL tablet Place 0.4 mg under the tongue every 5 (five) minutes as needed for chest pain.    . Omega-3 Fatty Acids (FISH OIL PO) Take 1 capsule by mouth daily.    Marland Kitchen OVER THE COUNTER MEDICATION Take 1 capsule by mouth 2 (two) times daily. OMEAGA XL    . polyethylene glycol powder (MIRALAX) powder Take 1 Container by mouth once.    . rosuvastatin (CRESTOR)  40 MG tablet Take 1 tablet (40 mg total) by mouth daily. 90 tablet 3  . tiZANidine (ZANAFLEX) 2 MG tablet Take 2 mg by mouth every 8 (eight) hours.  2   No current facility-administered medications for this visit.     Allergies  Allergen Reactions  . Codeine Nausea Only  . Lidoderm [Lidocaine]   . Paxil [Paroxetine Hcl]     Past Medical History:  Diagnosis Date  . AR (allergic rhinitis)   . CAD S/P percutaneous coronary angioplasty    3 stents on '01, one stent in '09, known CTO FCX  . Chronic back pain   . Constipation, unspecified 06/09/2009  . CVA, old, alterations of sensations   . Degeneration of cervical intervertebral disc   . Degeneration of lumbar or lumbosacral intervertebral disc 06/10/2007  . Depression   . Diverticulitis of colon  without hemorrhage   . Dyslipidemia   . Epidural abscess   . GERD (gastroesophageal reflux disease)   . Hypertension   . RBBB with left anterior fascicular block   . Spinal stenosis    HAD SURGERY    Social History   Socioeconomic History  . Marital status: Married    Spouse name: Not on file  . Number of children: Not on file  . Years of education: Not on file  . Highest education level: Not on file  Occupational History  . Not on file  Social Needs  . Financial resource strain: Not on file  . Food insecurity:    Worry: Not on file    Inability: Not on file  . Transportation needs:    Medical: Not on file    Non-medical: Not on file  Tobacco Use  . Smoking status: Never Smoker  . Smokeless tobacco: Never Used  Substance and Sexual Activity  . Alcohol use: Not on file  . Drug use: Not on file  . Sexual activity: Not on file  Lifestyle  . Physical activity:    Days per week: Not on file    Minutes per session: Not on file  . Stress: Not on file  Relationships  . Social connections:    Talks on phone: Not on file    Gets together: Not on file    Attends religious service: Not on file    Active member of club or organization: Not on file    Attends meetings of clubs or organizations: Not on file    Relationship status: Not on file  . Intimate partner violence:    Fear of current or ex partner: Not on file    Emotionally abused: Not on file    Physically abused: Not on file    Forced sexual activity: Not on file  Other Topics Concern  . Not on file  Social History Narrative  . Not on file      Review of Systems: General: negative for chills, fever, night sweats or weight changes.  HOH Cardiovascular: negative for chest pain, dyspnea on exertion, edema, orthopnea, palpitations, paroxysmal nocturnal dyspnea or shortness of breath Dermatological: negative for rash Respiratory: negative for cough or wheezing Urologic: negative for hematuria Abdominal:  negative for nausea, vomiting, diarrhea, bright red blood per rectum, melena, or hematemesis Neurologic: negative for visual changes, syncope, or dizziness All other systems reviewed and are otherwise negative except as noted above.    Blood pressure (!) 158/62, pulse (!) 50, height 5\' 8"  (1.727 m), weight 166 lb 6.4 oz (75.5 kg).  General appearance: alert, cooperative  and no distress Neck: no carotid bruit and no JVD Lungs: clear to auscultation bilaterally Heart: regular rate and rhythm Extremities: no edema Skin: Skin color, texture, turgor normal. No rashes or lesions Neurologic: Grossly normal  EKG NSR, SB-50 with RBBB and LAFB  ASSESSMENT AND PLAN:   CAD S/P percutaneous coronary angioplasty 3 stents on '01, one stent in '09, known CTO FCX  Dyslipidemia On statin Rx followed by PCP  Hypertension Controlled  RBBB with left anterior fascicular block Chronic- also noted to be bradycardic-HR 50  Pre-operative cardiovascular examination Pre op clearance prior to endoscopy 08/14/18   PLAN  I decreased Mr Gautreau's Atenolol from 75 mg BID to 50 mg BID.  He has a f/u with Dr Marlou Porch in January and will keep that.   Chart reviewed and patient seen and examined today as part of pre-operative protocol coverage. Given past medical history and time since last visit, based on ACC/AHA guidelines, Delawrence Potocki would be at acceptable risk for the planned procedure without further cardiovascular testing.   OK to hold plavix for 5-7 days as needed for procedure. Prefer to continue aspirin if possible but will defer to Dr Earlean Shawl.   I will route this recommendation to the requesting party via Epic fax function and remove from pre-op pool.  Please call with questions.   Kerin Ransom PA-C 08/11/2018 2:23 PM

## 2018-08-11 NOTE — Patient Instructions (Addendum)
Medication Instructions:  Decrease Atenolol to 50 mg (1 tablet) twice daily.  If you need a refill on your cardiac medications before your next appointment, please call your pharmacy.    Follow-Up: At [facility], you and your health needs are our priority.  As part of our continuing mission to provide you with exceptional heart care, we have created designated Provider Care Teams.  These Care Teams include your primary Cardiologist (physician) and Advanced Practice Providers (APPs -  Physician Assistants and Nurse Practitioners) who all work together to provide you with the care you need, when you need it. . Please keep your appointment with Dr. Candee Furbish in September 11, 2018 at 2:40 pm.  Any Other Special Instructions Will Be Listed Below (If Applicable). You have been cleared for your procedure. We will send your clearance to Dr. Liliane Channel office.

## 2018-08-11 NOTE — Assessment & Plan Note (Signed)
Pre op clearance prior to endoscopy 08/14/18

## 2018-08-11 NOTE — Assessment & Plan Note (Signed)
3 stents on '01, one stent in '09, known CTO FCX

## 2018-08-11 NOTE — Assessment & Plan Note (Signed)
On statin Rx- followed by PCP 

## 2018-08-12 ENCOUNTER — Ambulatory Visit: Payer: Medicare Other | Admitting: Physician Assistant

## 2018-08-14 DIAGNOSIS — K449 Diaphragmatic hernia without obstruction or gangrene: Secondary | ICD-10-CM | POA: Diagnosis not present

## 2018-08-14 DIAGNOSIS — R1013 Epigastric pain: Secondary | ICD-10-CM | POA: Diagnosis not present

## 2018-08-14 DIAGNOSIS — K148 Other diseases of tongue: Secondary | ICD-10-CM | POA: Diagnosis not present

## 2018-08-14 DIAGNOSIS — B3781 Candidal esophagitis: Secondary | ICD-10-CM | POA: Diagnosis not present

## 2018-08-14 DIAGNOSIS — R634 Abnormal weight loss: Secondary | ICD-10-CM | POA: Diagnosis not present

## 2018-08-14 DIAGNOSIS — K210 Gastro-esophageal reflux disease with esophagitis: Secondary | ICD-10-CM | POA: Diagnosis not present

## 2018-08-14 DIAGNOSIS — K31819 Angiodysplasia of stomach and duodenum without bleeding: Secondary | ICD-10-CM | POA: Diagnosis not present

## 2018-08-14 DIAGNOSIS — K317 Polyp of stomach and duodenum: Secondary | ICD-10-CM | POA: Diagnosis not present

## 2018-08-14 DIAGNOSIS — Q2733 Arteriovenous malformation of digestive system vessel: Secondary | ICD-10-CM | POA: Diagnosis not present

## 2018-08-14 DIAGNOSIS — R1011 Right upper quadrant pain: Secondary | ICD-10-CM | POA: Diagnosis not present

## 2018-08-15 DIAGNOSIS — K208 Other esophagitis: Secondary | ICD-10-CM | POA: Diagnosis not present

## 2018-08-18 DIAGNOSIS — R1013 Epigastric pain: Secondary | ICD-10-CM | POA: Diagnosis not present

## 2018-08-18 DIAGNOSIS — K208 Other esophagitis: Secondary | ICD-10-CM | POA: Diagnosis not present

## 2018-08-19 DIAGNOSIS — M545 Low back pain: Secondary | ICD-10-CM | POA: Diagnosis not present

## 2018-08-19 DIAGNOSIS — M21372 Foot drop, left foot: Secondary | ICD-10-CM | POA: Diagnosis not present

## 2018-08-19 DIAGNOSIS — M4807 Spinal stenosis, lumbosacral region: Secondary | ICD-10-CM | POA: Diagnosis not present

## 2018-08-19 DIAGNOSIS — M25559 Pain in unspecified hip: Secondary | ICD-10-CM | POA: Diagnosis not present

## 2018-08-21 DIAGNOSIS — M4807 Spinal stenosis, lumbosacral region: Secondary | ICD-10-CM | POA: Diagnosis not present

## 2018-08-21 DIAGNOSIS — M545 Low back pain: Secondary | ICD-10-CM | POA: Diagnosis not present

## 2018-08-21 DIAGNOSIS — M21372 Foot drop, left foot: Secondary | ICD-10-CM | POA: Diagnosis not present

## 2018-08-21 DIAGNOSIS — M25559 Pain in unspecified hip: Secondary | ICD-10-CM | POA: Diagnosis not present

## 2018-08-26 DIAGNOSIS — M25559 Pain in unspecified hip: Secondary | ICD-10-CM | POA: Diagnosis not present

## 2018-08-26 DIAGNOSIS — M21372 Foot drop, left foot: Secondary | ICD-10-CM | POA: Diagnosis not present

## 2018-08-26 DIAGNOSIS — M545 Low back pain: Secondary | ICD-10-CM | POA: Diagnosis not present

## 2018-08-26 DIAGNOSIS — M4807 Spinal stenosis, lumbosacral region: Secondary | ICD-10-CM | POA: Diagnosis not present

## 2018-08-28 DIAGNOSIS — M4807 Spinal stenosis, lumbosacral region: Secondary | ICD-10-CM | POA: Diagnosis not present

## 2018-08-28 DIAGNOSIS — M545 Low back pain: Secondary | ICD-10-CM | POA: Diagnosis not present

## 2018-08-28 DIAGNOSIS — M25559 Pain in unspecified hip: Secondary | ICD-10-CM | POA: Diagnosis not present

## 2018-08-28 DIAGNOSIS — K219 Gastro-esophageal reflux disease without esophagitis: Secondary | ICD-10-CM | POA: Diagnosis not present

## 2018-08-28 DIAGNOSIS — M21372 Foot drop, left foot: Secondary | ICD-10-CM | POA: Diagnosis not present

## 2018-09-01 DIAGNOSIS — M21372 Foot drop, left foot: Secondary | ICD-10-CM | POA: Diagnosis not present

## 2018-09-01 DIAGNOSIS — M545 Low back pain: Secondary | ICD-10-CM | POA: Diagnosis not present

## 2018-09-01 DIAGNOSIS — M25559 Pain in unspecified hip: Secondary | ICD-10-CM | POA: Diagnosis not present

## 2018-09-01 DIAGNOSIS — M4807 Spinal stenosis, lumbosacral region: Secondary | ICD-10-CM | POA: Diagnosis not present

## 2018-09-03 DIAGNOSIS — M21372 Foot drop, left foot: Secondary | ICD-10-CM | POA: Diagnosis not present

## 2018-09-03 DIAGNOSIS — M545 Low back pain: Secondary | ICD-10-CM | POA: Diagnosis not present

## 2018-09-03 DIAGNOSIS — M25559 Pain in unspecified hip: Secondary | ICD-10-CM | POA: Diagnosis not present

## 2018-09-03 DIAGNOSIS — M4807 Spinal stenosis, lumbosacral region: Secondary | ICD-10-CM | POA: Diagnosis not present

## 2018-09-04 DIAGNOSIS — T402X5A Adverse effect of other opioids, initial encounter: Secondary | ICD-10-CM | POA: Diagnosis not present

## 2018-09-04 DIAGNOSIS — K219 Gastro-esophageal reflux disease without esophagitis: Secondary | ICD-10-CM | POA: Diagnosis not present

## 2018-09-04 DIAGNOSIS — K5903 Drug induced constipation: Secondary | ICD-10-CM | POA: Diagnosis not present

## 2018-09-04 DIAGNOSIS — B3781 Candidal esophagitis: Secondary | ICD-10-CM | POA: Diagnosis not present

## 2018-09-08 DIAGNOSIS — M545 Low back pain: Secondary | ICD-10-CM | POA: Diagnosis not present

## 2018-09-08 DIAGNOSIS — M25559 Pain in unspecified hip: Secondary | ICD-10-CM | POA: Diagnosis not present

## 2018-09-08 DIAGNOSIS — M21372 Foot drop, left foot: Secondary | ICD-10-CM | POA: Diagnosis not present

## 2018-09-08 DIAGNOSIS — M4807 Spinal stenosis, lumbosacral region: Secondary | ICD-10-CM | POA: Diagnosis not present

## 2018-09-10 DIAGNOSIS — M25559 Pain in unspecified hip: Secondary | ICD-10-CM | POA: Diagnosis not present

## 2018-09-10 DIAGNOSIS — M4807 Spinal stenosis, lumbosacral region: Secondary | ICD-10-CM | POA: Diagnosis not present

## 2018-09-10 DIAGNOSIS — M21372 Foot drop, left foot: Secondary | ICD-10-CM | POA: Diagnosis not present

## 2018-09-10 DIAGNOSIS — M545 Low back pain: Secondary | ICD-10-CM | POA: Diagnosis not present

## 2018-09-11 ENCOUNTER — Encounter (INDEPENDENT_AMBULATORY_CARE_PROVIDER_SITE_OTHER): Payer: Self-pay

## 2018-09-11 ENCOUNTER — Encounter: Payer: Self-pay | Admitting: Cardiology

## 2018-09-11 ENCOUNTER — Ambulatory Visit (INDEPENDENT_AMBULATORY_CARE_PROVIDER_SITE_OTHER): Payer: Medicare Other | Admitting: Cardiology

## 2018-09-11 VITALS — BP 142/60 | HR 52 | Ht 68.0 in | Wt 165.8 lb

## 2018-09-11 DIAGNOSIS — Z9861 Coronary angioplasty status: Secondary | ICD-10-CM | POA: Diagnosis not present

## 2018-09-11 DIAGNOSIS — I452 Bifascicular block: Secondary | ICD-10-CM | POA: Diagnosis not present

## 2018-09-11 DIAGNOSIS — I2510 Atherosclerotic heart disease of native coronary artery without angina pectoris: Secondary | ICD-10-CM | POA: Diagnosis not present

## 2018-09-11 DIAGNOSIS — I10 Essential (primary) hypertension: Secondary | ICD-10-CM

## 2018-09-11 NOTE — Patient Instructions (Signed)
Medication Instructions:  The current medical regimen is effective;  continue present plan and medications.  If you need a refill on your cardiac medications before your next appointment, please call your pharmacy.   Follow-Up: At [facility], you and your health needs are our priority.  As part of our continuing mission to provide you with exceptional heart care, we have created designated Provider Care Teams.  These Care Teams include your primary Cardiologist (physician) and Advanced Practice Providers (APPs -  Physician Assistants and Nurse Practitioners) who all work together to provide you with the care you need, when you need it. You will need a follow up appointment in 12 months.  Please call our office 2 months in advance to schedule this appointment.  You may see Mark Skains, MD or one of the following Advanced Practice Providers on your designated Care Team:   Lori Gerhardt, NP Laura Ingold, NP . Jill McDaniel, NP  Thank you for choosing [facility]!!      

## 2018-09-11 NOTE — Progress Notes (Signed)
Cardiology Office Note:    Date:  09/11/2018   ID:  Christopher Ferguson, DOB [birth year], MRN [id number]  PCP:  Dineen Kid, MD  Cardiologist:  Candee Furbish, MD  Electrophysiologist:  None   Referring MD: Dineen Kid, MD     History of Present Illness:    Christopher Ferguson is a 83 y.o. male here for follow-up of coronary artery disease, previously has had 3 stents placed in 2001 and 1 stent placed in 2009.  His catheterization in 2009 showed a chronic occlusion of the circumflex.  No subsequent cardiac testing.  He has been doing quite well from a cardiac standpoint.  Back problems have been limiting him the most.  Spinal injections up to 4 times a year.  Has had lumbar spine surgery as well as cervical spine surgery  Has hypertension dyslipidemia bifascicular block  No fevers chills nausea vomiting syncope bleeding.  He did ask me if any of his supplements may be causing some stomach upset.  I asked him to try to eliminate fish oil to see if this helps.  Past Medical History:  Diagnosis Date  . AR (allergic rhinitis)   . CAD S/P percutaneous coronary angioplasty    3 stents on '01, one stent in '09, known CTO FCX  . Chronic back pain   . Constipation, unspecified 06/09/2009  . CVA, old, alterations of sensations   . Degeneration of cervical intervertebral disc   . Degeneration of lumbar or lumbosacral intervertebral disc 06/10/2007  . Depression   . Diverticulitis of colon without hemorrhage   . Dyslipidemia   . Epidural abscess   . GERD (gastroesophageal reflux disease)   . Hypertension   . RBBB with left anterior fascicular block   . Spinal stenosis    HAD SURGERY    Past Surgical History:  Procedure Laterality Date  . BACK SURGERY    . LAMINECTOMY  2009   OF C6-C7  . LUMBAR DISC SURGERY    . MOUTH SURGERY      Current Medications: Current Meds  Medication Sig  . Ascorbic Acid (VITAMIN C) 1000 MG tablet Take 1,000 mg by mouth daily.  Marland Kitchen aspirin EC 81 MG tablet Take 81 mg  by mouth daily.  Marland Kitchen atenolol (TENORMIN) 50 MG tablet Take 1 tablet (50 mg total) by mouth 2 (two) times daily.  . buprenorphine (BUTRANS - DOSED MCG/HR) 20 MCG/HR PTWK patch Place 20 mcg onto the skin every 7 (seven) days.   . Ca Carbonate-Mag Hydroxide (ROLAIDS PO) Take 1 tablet by mouth daily as needed. (for heartburn)  . Cholecalciferol (VITAMIN D3) 10000 units TABS Take 1,000 Units by mouth daily.  . clopidogrel (PLAVIX) 75 MG tablet Take 1 tablet (75 mg total) by mouth daily.  . Coenzyme Q10 (COQ10) 200 MG CAPS Take 200 mg by mouth daily.  . cyclobenzaprine (FLEXERIL) 5 MG tablet Take 5 mg by mouth as directed.  Marland Kitchen dexlansoprazole (DEXILANT) 60 MG capsule Take 60 mg by mouth daily.  Marland Kitchen dextromethorphan-guaiFENesin (MUCINEX DM) 30-600 MG 12hr tablet Take 1 tablet by mouth every 12 (twelve) hours as needed for congestion.  . DULoxetine (CYMBALTA) 20 MG capsule Take 10 mg by mouth daily.  . famotidine (PEPCID) 40 MG tablet Take 40 mg by mouth at bedtime.  Marland Kitchen Fexofenadine HCl (ALLEGRA PO) Take 1 tablet by mouth as directed.  . Flaxseed, Linseed, (FLAXSEED OIL) 1000 MG CAPS Take 1,000 mg by mouth daily.  Marland Kitchen HYDROcodone-acetaminophen (NORCO) 10-325 MG tablet Take 1 tablet by mouth  every 6 (six) hours as needed.  Marland Kitchen losartan (COZAAR) 25 MG tablet Take 1 tablet (25 mg total) by mouth daily.  Marland Kitchen LYCOPENE PO Take 1 tablet by mouth daily.  . mometasone (NASONEX) 50 MCG/ACT nasal spray Place 2 sprays into the nose daily.  . nitroGLYCERIN (NITROSTAT) 0.4 MG SL tablet Place 0.4 mg under the tongue every 5 (five) minutes as needed for chest pain.  . Omega-3 Fatty Acids (FISH OIL PO) Take 1 capsule by mouth daily.  Marland Kitchen OVER THE COUNTER MEDICATION Take 1 capsule by mouth 2 (two) times daily. OMEAGA XL  . polyethylene glycol powder (MIRALAX) powder Take 1 Container by mouth once.  . rosuvastatin (CRESTOR) 40 MG tablet Take 1 tablet (40 mg total) by mouth daily.  Marland Kitchen tiZANidine (ZANAFLEX) 2 MG tablet Take 2 mg by  mouth every 8 (eight) hours.     Allergies:   Codeine; Lidoderm [lidocaine]; and Paxil [paroxetine hcl]   Social History   Socioeconomic History  . Marital status: Married    Spouse name: Not on file  . Number of children: Not on file  . Years of education: Not on file  . Highest education level: Not on file  Occupational History  . Not on file  Social Needs  . Financial resource strain: Not on file  . Food insecurity:    Worry: Not on file    Inability: Not on file  . Transportation needs:    Medical: Not on file    Non-medical: Not on file  Tobacco Use  . Smoking status: Never Smoker  . Smokeless tobacco: Never Used  Substance and Sexual Activity  . Alcohol use: Not on file  . Drug use: Not on file  . Sexual activity: Not on file  Lifestyle  . Physical activity:    Days per week: Not on file    Minutes per session: Not on file  . Stress: Not on file  Relationships  . Social connections:    Talks on phone: Not on file    Gets together: Not on file    Attends religious service: Not on file    Active member of club or organization: Not on file    Attends meetings of clubs or organizations: Not on file    Relationship status: Not on file  Other Topics Concern  . Not on file  Social History Narrative  . Not on file     Family History: The patient's family history is not on file. No early CAD  ROS:   Please see the history of present illness.     All other systems reviewed and are negative.  EKGs/Labs/Other Studies Reviewed:    The following studies were reviewed today: Prior office notes lab work EKGs reviewed  EKG: Prior on 08/11/2018 shows sinus bradycardia first-degree AV block bifascicular block heart rate 50.  Recent Labs: No results found for requested labs within last 8760 hours.  Recent Lipid Panel No results found for: CHOL, TRIG, HDL, CHOLHDL, VLDL, LDLCALC, LDLDIRECT  Physical Exam:    VS:  BP (!) 142/60   Pulse (!) 52   Ht 5\' 8"  (1.727  m)   Wt 165 lb 12.8 oz (75.2 kg)   SpO2 95%   BMI 25.21 kg/m     Wt Readings from Last 3 Encounters:  09/11/18 165 lb 12.8 oz (75.2 kg)  08/11/18 166 lb 6.4 oz (75.5 kg)  09/10/17 163 lb 1.9 oz (74 kg)     GEN:  Well  nourished, well developed in no acute distress HEENT: Normal NECK: No JVD; No carotid bruits LYMPHATICS: No lymphadenopathy CARDIAC: RRR, no murmurs, rubs, gallops RESPIRATORY:  Clear to auscultation without rales, wheezing or rhonchi  ABDOMEN: Soft, non-tender, non-distended MUSCULOSKELETAL:  No edema; No deformity  SKIN: Warm and dry NEUROLOGIC:  Alert and oriented x 3 PSYCHIATRIC:  Normal affect   ASSESSMENT:    1. CAD S/P percutaneous coronary angioplasty   2. Essential hypertension   3. Bifascicular block    PLAN:    In order of problems listed above:  Coronary artery disease - 4 total stents in all, 3 in 2001, 1 in 2009.  Complete total occlusion of circumflex artery.  Overall been doing quite well without any significant anginal symptoms.  Aggressive secondary prevention.  His LDL is less than 70, currently 65.  He is on Plavix.  He may hold this prior to back injections.  - cath 9/09: no change 2/09: stent-pat-LAD, haziness in mLAD stent. Ectatic RCA,100Lcx - Cardiolite 3/09: negative  -cath-09/27/07: 60-70RCA, 100Lcx, 75%LAD, EF55 - Vision 2.5x18->LAD  Hyperlipidemia -Continue with statin therapy.  LDL goal less than 70.  Crestor 40.  He also takes co-Q10.  With his stomach discomfort, may wish to discontinue his fish oil and see if this helps.  Essential hypertension -Currently well controlled.  Bifascicular block - Has been bradycardic at times with heart rate in the 50s.  Doing well.  No syncope.  He has had atenolol decreased to 50 mg twice a day from 75.   Medication Adjustments/Labs and Tests Ordered: Current medicines are reviewed at length with the patient today.  Concerns regarding medicines are outlined above.  No orders of the  defined types were placed in this encounter.  No orders of the defined types were placed in this encounter.   Patient Instructions  Medication Instructions:  The current medical regimen is effective;  continue present plan and medications.  If you need a refill on your cardiac medications before your next appointment, please call your pharmacy.   Follow-Up: At [facility], you and your health needs are our priority.  As part of our continuing mission to provide you with exceptional heart care, we have created designated Provider Care Teams.  These Care Teams include your primary Cardiologist (physician) and Advanced Practice Providers (APPs -  Physician Assistants and Nurse Practitioners) who all work together to provide you with the care you need, when you need it. You will need a follow up appointment in 12 months.  Please call our office 2 months in advance to schedule this appointment.  You may see Candee Furbish, MD or one of the following Advanced Practice Providers on your designated Care Team:   Truitt Merle, NP Cecilie Kicks, NP . Kathyrn Drown, NP  Thank you for choosing [facility]!!          Signed, Candee Furbish, MD  09/11/2018 2:57 PM    [place]

## 2018-09-16 DIAGNOSIS — Z6825 Body mass index (BMI) 25.0-25.9, adult: Secondary | ICD-10-CM | POA: Diagnosis not present

## 2018-09-16 DIAGNOSIS — I10 Essential (primary) hypertension: Secondary | ICD-10-CM | POA: Diagnosis not present

## 2018-09-16 DIAGNOSIS — M5416 Radiculopathy, lumbar region: Secondary | ICD-10-CM | POA: Diagnosis not present

## 2018-09-16 DIAGNOSIS — M533 Sacrococcygeal disorders, not elsewhere classified: Secondary | ICD-10-CM | POA: Diagnosis not present

## 2018-09-18 DIAGNOSIS — M21372 Foot drop, left foot: Secondary | ICD-10-CM | POA: Diagnosis not present

## 2018-09-18 DIAGNOSIS — M4807 Spinal stenosis, lumbosacral region: Secondary | ICD-10-CM | POA: Diagnosis not present

## 2018-09-18 DIAGNOSIS — M25559 Pain in unspecified hip: Secondary | ICD-10-CM | POA: Diagnosis not present

## 2018-09-18 DIAGNOSIS — M545 Low back pain: Secondary | ICD-10-CM | POA: Diagnosis not present

## 2018-09-22 DIAGNOSIS — M4807 Spinal stenosis, lumbosacral region: Secondary | ICD-10-CM | POA: Diagnosis not present

## 2018-09-22 DIAGNOSIS — M21372 Foot drop, left foot: Secondary | ICD-10-CM | POA: Diagnosis not present

## 2018-09-22 DIAGNOSIS — M25559 Pain in unspecified hip: Secondary | ICD-10-CM | POA: Diagnosis not present

## 2018-09-22 DIAGNOSIS — M545 Low back pain: Secondary | ICD-10-CM | POA: Diagnosis not present

## 2018-09-24 DIAGNOSIS — E782 Mixed hyperlipidemia: Secondary | ICD-10-CM | POA: Diagnosis not present

## 2018-09-24 DIAGNOSIS — I10 Essential (primary) hypertension: Secondary | ICD-10-CM | POA: Diagnosis not present

## 2018-09-24 DIAGNOSIS — I2510 Atherosclerotic heart disease of native coronary artery without angina pectoris: Secondary | ICD-10-CM | POA: Diagnosis not present

## 2018-09-24 DIAGNOSIS — M549 Dorsalgia, unspecified: Secondary | ICD-10-CM | POA: Diagnosis not present

## 2018-09-24 DIAGNOSIS — Z125 Encounter for screening for malignant neoplasm of prostate: Secondary | ICD-10-CM | POA: Diagnosis not present

## 2018-09-24 DIAGNOSIS — Z9079 Acquired absence of other genital organ(s): Secondary | ICD-10-CM | POA: Diagnosis not present

## 2018-09-24 DIAGNOSIS — Z0000 Encounter for general adult medical examination without abnormal findings: Secondary | ICD-10-CM | POA: Diagnosis not present

## 2018-10-16 DIAGNOSIS — K219 Gastro-esophageal reflux disease without esophagitis: Secondary | ICD-10-CM | POA: Diagnosis not present

## 2018-10-16 DIAGNOSIS — K5900 Constipation, unspecified: Secondary | ICD-10-CM | POA: Diagnosis not present

## 2018-10-16 DIAGNOSIS — B3781 Candidal esophagitis: Secondary | ICD-10-CM | POA: Diagnosis not present

## 2018-10-30 DIAGNOSIS — M5416 Radiculopathy, lumbar region: Secondary | ICD-10-CM | POA: Diagnosis not present

## 2018-10-30 DIAGNOSIS — M533 Sacrococcygeal disorders, not elsewhere classified: Secondary | ICD-10-CM | POA: Diagnosis not present

## 2019-01-02 DIAGNOSIS — M961 Postlaminectomy syndrome, not elsewhere classified: Secondary | ICD-10-CM | POA: Diagnosis not present

## 2019-01-02 DIAGNOSIS — L0232 Furuncle of buttock: Secondary | ICD-10-CM | POA: Diagnosis not present

## 2019-01-02 DIAGNOSIS — R400 Somnolence: Secondary | ICD-10-CM | POA: Diagnosis not present

## 2019-01-29 DIAGNOSIS — M545 Low back pain: Secondary | ICD-10-CM | POA: Diagnosis not present

## 2019-02-09 DIAGNOSIS — M961 Postlaminectomy syndrome, not elsewhere classified: Secondary | ICD-10-CM | POA: Diagnosis not present

## 2019-02-09 DIAGNOSIS — M5416 Radiculopathy, lumbar region: Secondary | ICD-10-CM | POA: Diagnosis not present

## 2019-02-26 DIAGNOSIS — Z03818 Encounter for observation for suspected exposure to other biological agents ruled out: Secondary | ICD-10-CM | POA: Diagnosis not present

## 2019-03-09 DIAGNOSIS — M961 Postlaminectomy syndrome, not elsewhere classified: Secondary | ICD-10-CM | POA: Diagnosis not present

## 2019-03-09 DIAGNOSIS — M533 Sacrococcygeal disorders, not elsewhere classified: Secondary | ICD-10-CM | POA: Diagnosis not present

## 2019-03-31 DIAGNOSIS — E782 Mixed hyperlipidemia: Secondary | ICD-10-CM | POA: Diagnosis not present

## 2019-03-31 DIAGNOSIS — K219 Gastro-esophageal reflux disease without esophagitis: Secondary | ICD-10-CM | POA: Diagnosis not present

## 2019-03-31 DIAGNOSIS — Z23 Encounter for immunization: Secondary | ICD-10-CM | POA: Diagnosis not present

## 2019-03-31 DIAGNOSIS — I10 Essential (primary) hypertension: Secondary | ICD-10-CM | POA: Diagnosis not present

## 2019-04-01 DIAGNOSIS — H43393 Other vitreous opacities, bilateral: Secondary | ICD-10-CM | POA: Diagnosis not present

## 2019-04-01 DIAGNOSIS — Z961 Presence of intraocular lens: Secondary | ICD-10-CM | POA: Diagnosis not present

## 2019-04-04 ENCOUNTER — Other Ambulatory Visit: Payer: Self-pay | Admitting: Cardiology

## 2019-04-21 DIAGNOSIS — Z23 Encounter for immunization: Secondary | ICD-10-CM | POA: Diagnosis not present

## 2019-05-12 DIAGNOSIS — M545 Low back pain: Secondary | ICD-10-CM | POA: Diagnosis not present

## 2019-05-12 DIAGNOSIS — M961 Postlaminectomy syndrome, not elsewhere classified: Secondary | ICD-10-CM | POA: Diagnosis not present

## 2019-06-09 DIAGNOSIS — M533 Sacrococcygeal disorders, not elsewhere classified: Secondary | ICD-10-CM | POA: Diagnosis not present

## 2019-06-09 DIAGNOSIS — M961 Postlaminectomy syndrome, not elsewhere classified: Secondary | ICD-10-CM | POA: Diagnosis not present

## 2019-06-09 DIAGNOSIS — M5416 Radiculopathy, lumbar region: Secondary | ICD-10-CM | POA: Diagnosis not present

## 2019-07-01 DIAGNOSIS — D692 Other nonthrombocytopenic purpura: Secondary | ICD-10-CM | POA: Diagnosis not present

## 2019-07-01 DIAGNOSIS — Z85828 Personal history of other malignant neoplasm of skin: Secondary | ICD-10-CM | POA: Diagnosis not present

## 2019-07-01 DIAGNOSIS — L570 Actinic keratosis: Secondary | ICD-10-CM | POA: Diagnosis not present

## 2019-07-01 DIAGNOSIS — L820 Inflamed seborrheic keratosis: Secondary | ICD-10-CM | POA: Diagnosis not present

## 2019-07-01 DIAGNOSIS — L814 Other melanin hyperpigmentation: Secondary | ICD-10-CM | POA: Diagnosis not present

## 2019-07-01 DIAGNOSIS — L821 Other seborrheic keratosis: Secondary | ICD-10-CM | POA: Diagnosis not present

## 2019-07-01 DIAGNOSIS — D1801 Hemangioma of skin and subcutaneous tissue: Secondary | ICD-10-CM | POA: Diagnosis not present

## 2019-07-13 ENCOUNTER — Other Ambulatory Visit: Payer: Self-pay

## 2019-07-13 DIAGNOSIS — Z20822 Contact with and (suspected) exposure to covid-19: Secondary | ICD-10-CM

## 2019-07-15 ENCOUNTER — Telehealth: Payer: Self-pay | Admitting: Family Medicine

## 2019-07-15 LAB — NOVEL CORONAVIRUS, NAA: SARS-CoV-2, NAA: NOT DETECTED

## 2019-07-15 NOTE — Telephone Encounter (Signed)
Negative COVID results given. Patient results "NOT Detected." Caller expressed understanding. ° °

## 2019-09-15 ENCOUNTER — Ambulatory Visit (INDEPENDENT_AMBULATORY_CARE_PROVIDER_SITE_OTHER): Payer: Medicare PPO | Admitting: Cardiology

## 2019-09-15 ENCOUNTER — Other Ambulatory Visit: Payer: Self-pay

## 2019-09-15 ENCOUNTER — Encounter: Payer: Self-pay | Admitting: Cardiology

## 2019-09-15 VITALS — BP 130/60 | HR 61 | Ht 68.0 in | Wt 152.0 lb

## 2019-09-15 DIAGNOSIS — I452 Bifascicular block: Secondary | ICD-10-CM

## 2019-09-15 DIAGNOSIS — I2510 Atherosclerotic heart disease of native coronary artery without angina pectoris: Secondary | ICD-10-CM

## 2019-09-15 DIAGNOSIS — I10 Essential (primary) hypertension: Secondary | ICD-10-CM | POA: Diagnosis not present

## 2019-09-15 DIAGNOSIS — E785 Hyperlipidemia, unspecified: Secondary | ICD-10-CM | POA: Diagnosis not present

## 2019-09-15 DIAGNOSIS — Z9861 Coronary angioplasty status: Secondary | ICD-10-CM

## 2019-09-15 NOTE — Progress Notes (Signed)
Cardiology Office Note:    Date:  09/15/2019   ID:  Christopher Ferguson, DOB [birth year], MRN [id number]  PCP:  Kristen Loader, FNP  Cardiologist:  Candee Furbish, MD  Electrophysiologist:  None   Referring MD: Dineen Kid, MD     History of Present Illness:    Christopher Ferguson is a 84 y.o. male here for follow-up of coronary artery disease, 4 total stents with hyperlipidemia, chronic back pain. P  reviously has had 3 stents placed in 2001 and 1 stent placed in 2009.  His catheterization in 2009 showed a chronic occlusion of the circumflex.  No subsequent cardiac testing.  He has been doing quite well from a cardiac standpoint.  Back problems have been limiting him the most.  Spinal injections up to 4 times a year.  Has had lumbar spine surgery as well as cervical spine surgery  Has hypertension dyslipidemia bifascicular block  Previously suggested that fish oil may be causing some stomach upset. Has some pain after eating.  Dr. Earlean Shawl is going to be checking a CT scan to look for any signals of mesenteric ischemia.  He does not describe any exertional chest pain.  Does sound more GI.  Past Medical History:  Diagnosis Date   AR (allergic rhinitis)    CAD S/P percutaneous coronary angioplasty    3 stents on '01, one stent in '09, known CTO FCX   Chronic back pain    Constipation, unspecified 06/09/2009   CVA, old, alterations of sensations    Degeneration of cervical intervertebral disc    Degeneration of lumbar or lumbosacral intervertebral disc 06/10/2007   Depression    Diverticulitis of colon without hemorrhage    Dyslipidemia    Epidural abscess    GERD (gastroesophageal reflux disease)    Hypertension    RBBB with left anterior fascicular block    Spinal stenosis    HAD SURGERY    Past Surgical History:  Procedure Laterality Date   BACK SURGERY     LAMINECTOMY  2009   OF C6-C7   LUMBAR DISC SURGERY     MOUTH SURGERY      Current Medications: Current  Meds  Medication Sig   Ascorbic Acid (VITAMIN C) 1000 MG tablet Take 1,000 mg by mouth daily.   atenolol (TENORMIN) 50 MG tablet TAKE 1 TABLET TWICE A DAY   buprenorphine (BUTRANS - DOSED MCG/HR) 20 MCG/HR PTWK patch Place 20 mcg onto the skin every 7 (seven) days.    Ca Carbonate-Mag Hydroxide (ROLAIDS PO) Take 1 tablet by mouth daily as needed. (for heartburn)   Cholecalciferol (VITAMIN D3) 10000 units TABS Take 1,000 Units by mouth daily.   clopidogrel (PLAVIX) 75 MG tablet Take 1 tablet (75 mg total) by mouth daily.   Coenzyme Q10 (COQ10) 200 MG CAPS Take 200 mg by mouth daily.   cyclobenzaprine (FLEXERIL) 5 MG tablet Take 5 mg by mouth as directed.   dexlansoprazole (DEXILANT) 60 MG capsule Take 60 mg by mouth daily.   dextromethorphan-guaiFENesin (MUCINEX DM) 30-600 MG 12hr tablet Take 1 tablet by mouth every 12 (twelve) hours as needed for congestion.   DULoxetine (CYMBALTA) 20 MG capsule Take 10 mg by mouth daily.   famotidine (PEPCID) 40 MG tablet Take 40 mg by mouth at bedtime.   Fexofenadine HCl (ALLEGRA PO) Take 1 tablet by mouth as directed.   Flaxseed, Linseed, (FLAXSEED OIL) 1000 MG CAPS Take 1,000 mg by mouth daily.   HYDROcodone-acetaminophen (NORCO) 10-325 MG tablet Take 1  tablet by mouth every 6 (six) hours as needed.   losartan (COZAAR) 25 MG tablet Take 1 tablet (25 mg total) by mouth daily.   LYCOPENE PO Take 1 tablet by mouth daily.   mometasone (NASONEX) 50 MCG/ACT nasal spray Place 2 sprays into the nose daily.   nitroGLYCERIN (NITROSTAT) 0.4 MG SL tablet Place 0.4 mg under the tongue every 5 (five) minutes as needed for chest pain.   Omega-3 Fatty Acids (FISH OIL PO) Take 1 capsule by mouth daily.   OVER THE COUNTER MEDICATION Take 1 capsule by mouth 2 (two) times daily. OMEAGA XL   polyethylene glycol powder (MIRALAX) powder Take 1 Container by mouth once.   rosuvastatin (CRESTOR) 40 MG tablet Take 1 tablet (40 mg total) by mouth daily.    tiZANidine (ZANAFLEX) 2 MG tablet Take 2 mg by mouth every 8 (eight) hours.   [DISCONTINUED] aspirin EC 81 MG tablet Take 81 mg by mouth daily.     Allergies:   Codeine, Lidoderm [lidocaine], and Paxil [paroxetine hcl]   Social History   Socioeconomic History   Marital status: Married    Spouse name: Not on file   Number of children: Not on file   Years of education: Not on file   Highest education level: Not on file  Occupational History   Not on file  Tobacco Use   Smoking status: Never Smoker   Smokeless tobacco: Never Used  Substance and Sexual Activity   Alcohol use: Not on file   Drug use: Not on file   Sexual activity: Not on file  Other Topics Concern   Not on file  Social History Narrative   Not on file   Social Determinants of Health   Financial Resource Strain:    Difficulty of Paying Living Expenses: Not on file  Food Insecurity:    Worried About [place] in the Last Year: Not on file   Ran Out of Food in the Last Year: Not on file  Transportation Needs:    Lack of Transportation (Medical): Not on file   Lack of Transportation (Non-Medical): Not on file  Physical Activity:    Days of Exercise per Week: Not on file   Minutes of Exercise per Session: Not on file  Stress:    Feeling of Stress : Not on file  Social Connections:    Frequency of Communication with Friends and Family: Not on file   Frequency of Social Gatherings with Friends and Family: Not on file   Attends Religious Services: Not on file   Active Member of Clubs or Organizations: Not on file   Attends Archivist Meetings: Not on file   Marital Status: Not on file     Family History: The patient's family history is not on file. No early CAD  ROS:   Please see the history of present illness.     All other systems reviewed and are negative.  EKGs/Labs/Other Studies Reviewed:    The following studies were reviewed today: Prior office  notes lab work EKGs reviewed  EKG: 09/15/2019-sinus rhythm 61 right bundle branch block nonspecific ST-T wave changes left anterior fascicular block, bifascicular block prior on 08/11/2018 shows sinus bradycardia first-degree AV block bifascicular block heart rate 50.  Recent Labs: No results found for requested labs within last 8760 hours.  Recent Lipid Panel No results found for: CHOL, TRIG, HDL, CHOLHDL, VLDL, LDLCALC, LDLDIRECT  Physical Exam:    VS:  BP 130/60  Pulse 61    Ht 5\' 8"  (1.727 m)    Wt 152 lb (68.9 kg)    BMI 23.11 kg/m     Wt Readings from Last 3 Encounters:  09/15/19 152 lb (68.9 kg)  09/11/18 165 lb 12.8 oz (75.2 kg)  08/11/18 166 lb 6.4 oz (75.5 kg)     GEN:  Well nourished, well developed in no acute distress HEENT: Normal NECK: No JVD; No carotid bruits LYMPHATICS: No lymphadenopathy CARDIAC: RRR, no murmurs, rubs, gallops RESPIRATORY:  Clear to auscultation without rales, wheezing or rhonchi  ABDOMEN: Soft, non-tender, non-distended MUSCULOSKELETAL:  No edema; No deformity  SKIN: Warm and dry NEUROLOGIC:  Alert and oriented x 3 PSYCHIATRIC:  Normal affect   ASSESSMENT:    1. CAD S/P percutaneous coronary angioplasty   2. Essential hypertension   3. Bifascicular block   4. RBBB with left anterior fascicular block   5. Dyslipidemia    PLAN:    In order of problems listed above:  Coronary artery disease - 4 total stents in all, 3 in 2001, 1 in 2009.  Complete total occlusion of circumflex artery.  Overall been doing quite well without any significant anginal symptoms.  Sounds like his symptoms are more abdominal postprandial.  Aggressive secondary prevention.  His LDL is less than 70, prior 58 from outside labs.  ALT is 19, creatinine is 0.9, hemoglobin 13 - cath 9/09: no change 2/09: stent-pat-LAD, haziness in mLAD stent. Ectatic RCA,100Lcx - Cardiolite 3/09: negative  -cath-09/27/07: 60-70RCA, 100Lcx, 75%LAD, EF55 - Vision 2.5x18->LAD -I will go  ahead and stop his baby aspirin 81 mg and he will continue with Plavix 75 mg.  Once again, it is okay for him to hold this prior to back injections at pain clinic.  Being on Plavix monotherapy will help reduce his risks of bleeding.  Hyperlipidemia -Continue with statin therapy.  LDL goal less than 70.  He is there.  Crestor 40.  He also takes co-Q10.    Essential hypertension -Currently well controlled.  Medications reviewed.  Bifascicular block - Has been bradycardic at times with heart rate in the 50s.  He has had atenolol decreased to 50 mg twice a day from 75.  Denies any syncope.   Medication Adjustments/Labs and Tests Ordered: Current medicines are reviewed at length with the patient today.  Concerns regarding medicines are outlined above.  Orders Placed This Encounter  Procedures   EKG 12-Lead   No orders of the defined types were placed in this encounter.   Patient Instructions  Medication Instructions:  Please discontinue your Aspirin.  Continue all other medications as listed.  *If you need a refill on your cardiac medications before your next appointment, please call your pharmacy*  Follow-Up: At [facility], you and your health needs are our priority.  As part of our continuing mission to provide you with exceptional heart care, we have created designated Provider Care Teams.  These Care Teams include your primary Cardiologist (physician) and Advanced Practice Providers (APPs -  Physician Assistants and Nurse Practitioners) who all work together to provide you with the care you need, when you need it.  Your next appointment:   6 month(s)  The format for your next appointment:   In Person  Provider:   Candee Furbish, MD  Thank you for choosing [facility]!!         Signed, Candee Furbish, MD  09/15/2019 3:23 PM    [place]

## 2019-09-15 NOTE — Patient Instructions (Addendum)
Medication Instructions:  Please discontinue your Aspirin.  Continue all other medications as listed.  *If you need a refill on your cardiac medications before your next appointment, please call your pharmacy*  Follow-Up: At [facility], you and your health needs are our priority.  As part of our continuing mission to provide you with exceptional heart care, we have created designated Provider Care Teams.  These Care Teams include your primary Cardiologist (physician) and Advanced Practice Providers (APPs -  Physician Assistants and Nurse Practitioners) who all work together to provide you with the care you need, when you need it.  Your next appointment:   6 month(s)  The format for your next appointment:   In Person  Provider:   Candee Furbish, MD  Thank you for choosing [facility]!!

## 2019-09-16 ENCOUNTER — Other Ambulatory Visit: Payer: Self-pay | Admitting: Internal Medicine

## 2019-09-16 DIAGNOSIS — R109 Unspecified abdominal pain: Secondary | ICD-10-CM

## 2019-09-25 ENCOUNTER — Telehealth: Payer: Self-pay | Admitting: Cardiology

## 2019-09-25 NOTE — Telephone Encounter (Signed)
Patient is calling to inform Dr. Marlou Porch he can now access the results he was requesting from [facility] through epic.

## 2019-09-28 NOTE — Telephone Encounter (Signed)
Pt advised and will let us know if he has any further questions... results sent to pt My Chart.

## 2019-09-28 NOTE — Telephone Encounter (Signed)
LMTCB

## 2019-09-28 NOTE — Telephone Encounter (Signed)
Reviewed results of CT of abd.  Continue with Crestor and Plavix. Thankfully no significant stenosis identified. Candee Furbish, MD

## 2019-11-04 ENCOUNTER — Telehealth: Payer: Self-pay | Admitting: Cardiology

## 2019-11-04 NOTE — Telephone Encounter (Signed)
Patient calling because he has been having pain in his stomach. His gastro doctor, Dr. Earlean Shawl, believes it may be cardiac related. He recommended Mr. Christopher Ferguson get seen ASAP. Patient has 4 stents and he needs to make sure everything is working correctly. I offered him the appt with Dr. Marlou Porch tomorrow at 2:20pm but he has another appt at 2:00pm. He did not want to schedule with an APP so we scheduled him for 11/20/19 at 11:00am with Dr. Marlou Porch. He states that Dr. Earlean Shawl was to fax over a letter in regards to what is going on.

## 2019-11-04 NOTE — Telephone Encounter (Signed)
Noted - will forward to Dr Marlou Porch for his knowledge.

## 2019-11-04 NOTE — Telephone Encounter (Signed)
Patient calling to make sure we had correct phone number.

## 2019-11-05 ENCOUNTER — Telehealth: Payer: Self-pay | Admitting: Cardiology

## 2019-11-05 NOTE — Telephone Encounter (Signed)
Pt only wants to see Dr Marlou Porch.  Scheduled for next available with him 11/17/2019 at 11:40 AM.  Pt is aware and is agreeable.

## 2019-11-05 NOTE — Telephone Encounter (Signed)
New message:   Patient calling stating that he  Needs a sooner apt. Please call patient back.

## 2019-11-05 NOTE — Telephone Encounter (Signed)
Thanks for the update Maureen Delatte, MD  

## 2019-11-17 ENCOUNTER — Encounter: Payer: Self-pay | Admitting: Cardiology

## 2019-11-17 ENCOUNTER — Encounter: Payer: Self-pay | Admitting: *Deleted

## 2019-11-17 ENCOUNTER — Ambulatory Visit (INDEPENDENT_AMBULATORY_CARE_PROVIDER_SITE_OTHER): Payer: Medicare PPO | Admitting: Cardiology

## 2019-11-17 ENCOUNTER — Other Ambulatory Visit: Payer: Self-pay

## 2019-11-17 VITALS — BP 130/44 | HR 58 | Ht 68.0 in | Wt 154.0 lb

## 2019-11-17 DIAGNOSIS — I452 Bifascicular block: Secondary | ICD-10-CM | POA: Diagnosis not present

## 2019-11-17 DIAGNOSIS — E785 Hyperlipidemia, unspecified: Secondary | ICD-10-CM | POA: Diagnosis not present

## 2019-11-17 DIAGNOSIS — R079 Chest pain, unspecified: Secondary | ICD-10-CM

## 2019-11-17 DIAGNOSIS — I2510 Atherosclerotic heart disease of native coronary artery without angina pectoris: Secondary | ICD-10-CM | POA: Diagnosis not present

## 2019-11-17 DIAGNOSIS — Z9861 Coronary angioplasty status: Secondary | ICD-10-CM

## 2019-11-17 NOTE — Progress Notes (Signed)
Cardiology Office Note:    Date:  11/17/2019   ID:  Christopher Ferguson, DOB 1936-01-25, MRN [id number]  PCP:  Kristen Loader, FNP  Cardiologist:  Candee Furbish, MD  Electrophysiologist:  None   Referring MD: Kristen Loader, FNP     History of Present Illness:    Christopher Ferguson is a 84 y.o. male here for follow-up CAD.  Dr. Earlean Shawl had performed abdominal CT.  Unremarkable.  He continues to have epigastric discomfort.  Has had 4 total stents placed, 3 in 2001, 1 in 2009.  In 2009 chronic occlusion of the circumflex noted.  Last night had indigestion and hard to sleep. 5 weeks ago, epigastric pain as well. NTG x 3 seemed to help.   Past Medical History:  Diagnosis Date  . AR (allergic rhinitis)   . CAD S/P percutaneous coronary angioplasty    3 stents on '01, one stent in '09, known CTO FCX  . Chronic back pain   . Constipation, unspecified 06/09/2009  . CVA, old, alterations of sensations   . Degeneration of cervical intervertebral disc   . Degeneration of lumbar or lumbosacral intervertebral disc 06/10/2007  . Depression   . Diverticulitis of colon without hemorrhage   . Dyslipidemia   . Epidural abscess   . GERD (gastroesophageal reflux disease)   . Hypertension   . RBBB with left anterior fascicular block   . Spinal stenosis    HAD SURGERY    Past Surgical History:  Procedure Laterality Date  . BACK SURGERY    . LAMINECTOMY  2009   OF C6-C7  . LUMBAR DISC SURGERY    . MOUTH SURGERY      Current Medications: Current Meds  Medication Sig  . Ascorbic Acid (VITAMIN C) 1000 MG tablet Take 1,000 mg by mouth daily.  Marland Kitchen atenolol (TENORMIN) 50 MG tablet TAKE 1 TABLET TWICE A DAY  . buprenorphine (BUTRANS - DOSED MCG/HR) 20 MCG/HR PTWK patch Place 20 mcg onto the skin every 7 (seven) days.   . Ca Carbonate-Mag Hydroxide (ROLAIDS PO) Take 1 tablet by mouth daily as needed. (for heartburn)  . Cholecalciferol (VITAMIN D3) 10000 units TABS Take 1,000 Units by mouth daily.  .  clopidogrel (PLAVIX) 75 MG tablet Take 1 tablet (75 mg total) by mouth daily.  . Coenzyme Q10 (COQ10) 200 MG CAPS Take 200 mg by mouth daily.  . cyclobenzaprine (FLEXERIL) 5 MG tablet Take 5 mg by mouth as directed.  Marland Kitchen dexlansoprazole (DEXILANT) 60 MG capsule Take 60 mg by mouth daily.  Marland Kitchen dextromethorphan-guaiFENesin (MUCINEX DM) 30-600 MG 12hr tablet Take 1 tablet by mouth every 12 (twelve) hours as needed for congestion.  . DULoxetine (CYMBALTA) 20 MG capsule Take 10 mg by mouth daily.  . famotidine (PEPCID) 40 MG tablet Take 40 mg by mouth at bedtime.  Marland Kitchen Fexofenadine HCl (ALLEGRA PO) Take 1 tablet by mouth as directed.  . Flaxseed, Linseed, (FLAXSEED OIL) 1000 MG CAPS Take 1,000 mg by mouth daily.  Marland Kitchen HYDROcodone-acetaminophen (NORCO) 10-325 MG tablet Take 1 tablet by mouth every 6 (six) hours as needed.  Marland Kitchen losartan (COZAAR) 25 MG tablet Take 1 tablet (25 mg total) by mouth daily.  Marland Kitchen LYCOPENE PO Take 1 tablet by mouth daily.  . mometasone (NASONEX) 50 MCG/ACT nasal spray Place 2 sprays into the nose daily.  . nitroGLYCERIN (NITROSTAT) 0.4 MG SL tablet Place 0.4 mg under the tongue every 5 (five) minutes as needed for chest pain.  . Omega-3 Fatty Acids (FISH  OIL PO) Take 1 capsule by mouth daily.  Marland Kitchen OVER THE COUNTER MEDICATION Take 1 capsule by mouth 2 (two) times daily. OMEAGA XL  . polyethylene glycol powder (MIRALAX) powder Take 1 Container by mouth once.  . rosuvastatin (CRESTOR) 40 MG tablet Take 1 tablet (40 mg total) by mouth daily.  Marland Kitchen tiZANidine (ZANAFLEX) 2 MG tablet Take 2 mg by mouth every 8 (eight) hours.     Allergies:   Codeine, Lidoderm [lidocaine], and Paxil [paroxetine hcl]   Social History   Socioeconomic History  . Marital status: Married    Spouse name: Not on file  . Number of children: Not on file  . Years of education: Not on file  . Highest education level: Not on file  Occupational History  . Not on file  Tobacco Use  . Smoking status: Never Smoker  .  Smokeless tobacco: Never Used  Substance and Sexual Activity  . Alcohol use: Not on file  . Drug use: Not on file  . Sexual activity: Not on file  Other Topics Concern  . Not on file  Social History Narrative  . Not on file   Social Determinants of Health   Financial Resource Strain:   . Difficulty of Paying Living Expenses:   Food Insecurity:   . Worried About Charity fundraiser in the Last Year:   . Arboriculturist in the Last Year:   Transportation Needs:   . Film/video editor (Medical):   Marland Kitchen Lack of Transportation (Non-Medical):   Physical Activity:   . Days of Exercise per Week:   . Minutes of Exercise per Session:   Stress:   . Feeling of Stress :   Social Connections:   . Frequency of Communication with Friends and Family:   . Frequency of Social Gatherings with Friends and Family:   . Attends Religious Services:   . Active Member of Clubs or Organizations:   . Attends Archivist Meetings:   Marland Kitchen Marital Status:      ROS:   Please see the history of present illness.     All other systems reviewed and are negative.  EKGs/Labs/Other Studies Reviewed:    The following studies were reviewed today: nuc stress p  EKG:  EKG is not ordered today.    Recent Labs: No results found for requested labs within last 8760 hours.  Recent Lipid Panel No results found for: CHOL, TRIG, HDL, CHOLHDL, VLDL, LDLCALC, LDLDIRECT  Physical Exam:    VS:  BP (!) 130/44   Pulse (!) 58   Ht 5\' 8"  (1.727 m)   Wt 154 lb (69.9 kg)   SpO2 98%   BMI 23.42 kg/m     Wt Readings from Last 3 Encounters:  11/17/19 154 lb (69.9 kg)  09/15/19 152 lb (68.9 kg)  09/11/18 165 lb 12.8 oz (75.2 kg)     GEN:  Well nourished, well developed in no acute distress HEENT: Normal NECK: No JVD; No carotid bruits LYMPHATICS: No lymphadenopathy CARDIAC: RRR, no murmurs, rubs, gallops RESPIRATORY:  Clear to auscultation without rales, wheezing or rhonchi  ABDOMEN: Soft, non-tender,  non-distended MUSCULOSKELETAL:  No edema; No deformity  SKIN: Warm and dry NEUROLOGIC:  Alert and oriented x 3 PSYCHIATRIC:  Normal affect   ASSESSMENT:    1. CAD S/P percutaneous coronary angioplasty   2. Chest pain, unspecified type   3. Bifascicular block   4. Dyslipidemia    PLAN:    In order  of problems listed above:  Coronary artery disease/epigastric pain/chest discomfort -We will go ahead and proceed with a pharmacologic stress test to search for any high risk areas of ischemia.  Last heart catheterization was done in 2009 with successful bare-metal stent of the LAD.  The left circumflex was totally occluded after giving off a very large OM branch.  There were left to left and right to left collaterals feeding the distal circumflex.  The right coronary artery had diffuse disease with multiple 50 to 60% plaques throughout.  EF was 65%.  The LAD had a patent stent with 20% in-stent restenosis in the proximal segment and was calcified throughout.  The distal had a 80% lesion where balloon angioplasty and bare-metal stent took place.  Details are seen in care everywhere. -If stress test comes back high risk, we will discuss proceeding with angiogram.  His chest discomfort/epigastric discomfort was relieved with nitroglycerin at one point.  We could always consider long-acting nitrate, isosorbide.  Hyperlipidemia -Continue with Crestor, LDL 53 excellent.  Bifascicular block -Has had his atenolol decreased to 50 mg twice a day from 75.  Heart rate currently 58.  No syncope.  2 mth follow up or sooner if needed  Medication Adjustments/Labs and Tests Ordered: Current medicines are reviewed at length with the patient today.  Concerns regarding medicines are outlined above.  Orders Placed This Encounter  Procedures  . MYOCARDIAL PERFUSION IMAGING   No orders of the defined types were placed in this encounter.   Patient Instructions  Medication Instructions:  The current medical  regimen is effective;  continue present plan and medications.  *If you need a refill on your cardiac medications before your next appointment, please call your pharmacy*  Testing/Procedures: Your physician has requested that you have a lexiscan myoview. For further information please visit [email or web address]. Please follow instruction sheet, as given.  Follow-Up: At [facility], you and your health needs are our priority.  As part of our continuing mission to provide you with exceptional heart care, we have created designated Provider Care Teams.  These Care Teams include your primary Cardiologist (physician) and Advanced Practice Providers (APPs -  Physician Assistants and Nurse Practitioners) who all work together to provide you with the care you need, when you need it.  We recommend signing up for the patient portal called "MyChart".  Sign up information is provided on this After Visit Summary.  MyChart is used to connect with patients for Virtual Visits (Telemedicine).  Patients are able to view lab/test results, encounter notes, upcoming appointments, etc.  Non-urgent messages can be sent to your provider as well.   To learn more about what you can do with MyChart, go to [email or web address].    Your next appointment:   2 month(s)  The format for your next appointment:   In Person  Provider:   Candee Furbish, MD   Thank you for choosing [facility]!!         Signed, Candee Furbish, MD  11/17/2019 1:16 PM    [place]

## 2019-11-17 NOTE — Patient Instructions (Signed)
Medication Instructions:  The current medical regimen is effective;  continue present plan and medications.  *If you need a refill on your cardiac medications before your next appointment, please call your pharmacy*  Testing/Procedures: Your physician has requested that you have a lexiscan myoview. For further information please visit [email or web address]. Please follow instruction sheet, as given.  Follow-Up: At [facility], you and your health needs are our priority.  As part of our continuing mission to provide you with exceptional heart care, we have created designated Provider Care Teams.  These Care Teams include your primary Cardiologist (physician) and Advanced Practice Providers (APPs -  Physician Assistants and Nurse Practitioners) who all work together to provide you with the care you need, when you need it.  We recommend signing up for the patient portal called "MyChart".  Sign up information is provided on this After Visit Summary.  MyChart is used to connect with patients for Virtual Visits (Telemedicine).  Patients are able to view lab/test results, encounter notes, upcoming appointments, etc.  Non-urgent messages can be sent to your provider as well.   To learn more about what you can do with MyChart, go to [email or web address].    Your next appointment:   2 month(s)  The format for your next appointment:   In Person  Provider:   Mark Skains, MD   Thank you for choosing  [facility]!!      

## 2019-11-19 ENCOUNTER — Telehealth (HOSPITAL_COMMUNITY): Payer: Self-pay | Admitting: *Deleted

## 2019-11-19 NOTE — Telephone Encounter (Signed)
Patient given detailed instructions per Myocardial Perfusion Study Information Sheet for the test on 11/23/19 at 10:30. Patient notified to arrive 15 minutes early and that it is imperative to arrive on time for appointment to keep from having the test rescheduled.  If you need to cancel or reschedule your appointment, please call the office within 24 hours of your appointment. . Patient verbalized understanding.Christopher Ferguson

## 2019-11-20 ENCOUNTER — Ambulatory Visit: Payer: Medicare PPO | Admitting: Cardiology

## 2019-11-23 ENCOUNTER — Other Ambulatory Visit: Payer: Self-pay

## 2019-11-23 ENCOUNTER — Telehealth: Payer: Self-pay

## 2019-11-23 ENCOUNTER — Ambulatory Visit (HOSPITAL_COMMUNITY): Payer: Medicare PPO | Attending: Cardiology

## 2019-11-23 VITALS — Ht 68.0 in | Wt 154.0 lb

## 2019-11-23 DIAGNOSIS — R079 Chest pain, unspecified: Secondary | ICD-10-CM | POA: Diagnosis present

## 2019-11-23 DIAGNOSIS — I2510 Atherosclerotic heart disease of native coronary artery without angina pectoris: Secondary | ICD-10-CM | POA: Insufficient documentation

## 2019-11-23 DIAGNOSIS — Z9861 Coronary angioplasty status: Secondary | ICD-10-CM | POA: Diagnosis not present

## 2019-11-23 DIAGNOSIS — R110 Nausea: Secondary | ICD-10-CM | POA: Diagnosis present

## 2019-11-23 LAB — MYOCARDIAL PERFUSION IMAGING
LV dias vol: 131 mL (ref 62–150)
LV sys vol: 64 mL
Peak HR: 76 {beats}/min
Rest HR: 57 {beats}/min
SDS: 12
SRS: 5
SSS: 17
TID: 0.94

## 2019-11-23 MED ORDER — REGADENOSON 0.4 MG/5ML IV SOLN
0.4000 mg | Freq: Once | INTRAVENOUS | Status: AC
  Administered 2019-11-23: 0.4 mg via INTRAVENOUS

## 2019-11-23 MED ORDER — AMINOPHYLLINE 25 MG/ML IV SOLN
75.0000 mg | Freq: Once | INTRAVENOUS | Status: AC
  Administered 2019-11-23: 75 mg via INTRAVENOUS

## 2019-11-23 MED ORDER — TECHNETIUM TC 99M TETROFOSMIN IV KIT
31.6000 | PACK | Freq: Once | INTRAVENOUS | Status: AC | PRN
  Administered 2019-11-23: 31.6 via INTRAVENOUS
  Filled 2019-11-23: qty 32

## 2019-11-23 MED ORDER — TECHNETIUM TC 99M TETROFOSMIN IV KIT
10.8000 | PACK | Freq: Once | INTRAVENOUS | Status: AC | PRN
  Administered 2019-11-23: 10.8 via INTRAVENOUS
  Filled 2019-11-23: qty 11

## 2019-11-23 NOTE — Telephone Encounter (Signed)
lpmtcb 4/12 

## 2019-11-23 NOTE — Telephone Encounter (Signed)
-----   Message from Jerline Pain, MD sent at 11/23/2019  3:34 PM EDT ----- Stress test shows area of occluded circumflex artery which was previously known.  No other areas of significant concern detected.  As was discussed in clinic, if he would like [place] to, we can start isosorbide 30 mg once a day to help reduce episodes of angina.  We have an upcoming follow-up in about 2 months.  Candee Furbish, MD

## 2019-11-24 ENCOUNTER — Telehealth: Payer: Self-pay

## 2019-11-24 NOTE — Telephone Encounter (Signed)
lmtcb 4/13

## 2019-11-24 NOTE — Telephone Encounter (Signed)
-----   Message from Jerline Pain, MD sent at 11/23/2019  3:34 PM EDT ----- Stress test shows area of occluded circumflex artery which was previously known.  No other areas of significant concern detected.  As was discussed in clinic, if he would like [place] to, we can start isosorbide 30 mg once a day to help reduce episodes of angina.  We have an upcoming follow-up in about 2 months.  Candee Furbish, MD

## 2019-11-25 ENCOUNTER — Telehealth: Payer: Self-pay

## 2019-11-25 MED ORDER — ISOSORBIDE MONONITRATE ER 30 MG PO TB24: 30.0000 mg | ORAL_TABLET | Freq: Every day | ORAL | 3 refills | Status: DC

## 2019-11-25 NOTE — Telephone Encounter (Signed)
-----   Message from Jerline Pain, MD sent at 11/23/2019  3:34 PM EDT ----- Stress test shows area of occluded circumflex artery which was previously known.  No other areas of significant concern detected.  As was discussed in clinic, if he would like [place] to, we can start isosorbide 30 mg once a day to help reduce episodes of angina.  We have an upcoming follow-up in about 2 months.  Candee Furbish, MD

## 2019-11-25 NOTE — Telephone Encounter (Signed)
Spoke with the pt and the RX was sent in... see 11/25/19 phone call re: pt stress test results.

## 2019-11-25 NOTE — Telephone Encounter (Signed)
Pt verbalized understanding of his stress test results and would like to try the Isosorbide that Dr. Marlou Porch had reccommended. Will keep follow up 01/18/20 but will call sooner if he has any questions or problems.

## 2020-01-01 DIAGNOSIS — S39012A Strain of muscle, fascia and tendon of lower back, initial encounter: Secondary | ICD-10-CM | POA: Diagnosis not present

## 2020-01-01 DIAGNOSIS — M961 Postlaminectomy syndrome, not elsewhere classified: Secondary | ICD-10-CM | POA: Diagnosis not present

## 2020-01-01 DIAGNOSIS — Z981 Arthrodesis status: Secondary | ICD-10-CM | POA: Diagnosis not present

## 2020-01-01 DIAGNOSIS — S39012S Strain of muscle, fascia and tendon of lower back, sequela: Secondary | ICD-10-CM | POA: Diagnosis not present

## 2020-01-08 NOTE — Telephone Encounter (Signed)
Follow Up  Patient is returning call for stress test results. Please give patient a call back to discuss.

## 2020-01-08 NOTE — Telephone Encounter (Signed)
Spoke with pt to clarify what he was calling in regards to since his results were reviewed with him back in April.  Pt stated he was just calling to make sure that was the same report he had received on MyChart.  Advised it likely is if it was referring to a stress test.  Reminded him of appt with Dr. Marlou Porch on 6/7.  Did inquire if Imdur helped pt since starting it.  He states it did not, so he stopped it awhile ago but chest discomfort has improved now, even without the med. Pt appreciative for call.

## 2020-01-08 NOTE — Addendum Note (Signed)
Addended by: Loren Racer on: 01/08/2020 03:37 PM   Modules accepted: Orders

## 2020-01-18 ENCOUNTER — Ambulatory Visit (INDEPENDENT_AMBULATORY_CARE_PROVIDER_SITE_OTHER): Payer: Medicare PPO | Admitting: Cardiology

## 2020-01-18 ENCOUNTER — Other Ambulatory Visit: Payer: Self-pay

## 2020-01-18 ENCOUNTER — Encounter: Payer: Self-pay | Admitting: Cardiology

## 2020-01-18 VITALS — BP 124/60 | HR 60 | Ht 68.0 in | Wt 152.0 lb

## 2020-01-18 DIAGNOSIS — Z9861 Coronary angioplasty status: Secondary | ICD-10-CM

## 2020-01-18 DIAGNOSIS — I2510 Atherosclerotic heart disease of native coronary artery without angina pectoris: Secondary | ICD-10-CM

## 2020-01-18 DIAGNOSIS — R079 Chest pain, unspecified: Secondary | ICD-10-CM

## 2020-01-18 DIAGNOSIS — I452 Bifascicular block: Secondary | ICD-10-CM | POA: Diagnosis not present
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
## 2020-01-18 NOTE — Patient Instructions (Signed)
Medication Instructions:  The current medical regimen is effective;  continue present plan and medications.  *If you need a refill on your cardiac medications before your next appointment, please call your pharmacy*  Follow-Up: At [facility], you and your health needs are our priority.  As part of our continuing mission to provide you with exceptional heart care, we have created designated Provider Care Teams.  These Care Teams include your primary Cardiologist (physician) and Advanced Practice Providers (APPs -  Physician Assistants and Nurse Practitioners) who all work together to provide you with the care you need, when you need it.  We recommend signing up for the patient portal called "MyChart".  Sign up information is provided on this After Visit Summary.  MyChart is used to connect with patients for Virtual Visits (Telemedicine).  Patients are able to view lab/test results, encounter notes, upcoming appointments, etc.  Non-urgent messages can be sent to your provider as well.   To learn more about what you can do with MyChart, go to [email or web address].    Your next appointment:   12 month(s)  The format for your next appointment:   In Person  Provider:   Mark Skains, MD   Thank you for choosing [facility]!!      

## 2020-02-11 DIAGNOSIS — M5416 Radiculopathy, lumbar region: Secondary | ICD-10-CM | POA: Diagnosis not present

## 2020-02-11 DIAGNOSIS — M533 Sacrococcygeal disorders, not elsewhere classified: Secondary | ICD-10-CM | POA: Diagnosis not present

## 2020-02-11 DIAGNOSIS — M961 Postlaminectomy syndrome, not elsewhere classified: Secondary | ICD-10-CM | POA: Diagnosis not present

## 2020-02-22 ENCOUNTER — Inpatient Hospital Stay (HOSPITAL_COMMUNITY)
Admission: EM | Admit: 2020-02-22 | Discharge: 2020-02-25 | DRG: 251 | Disposition: A | Discharge status: Home Health | Payer: Medicare PPO | Attending: Cardiovascular Disease | Admitting: Cardiovascular Disease

## 2020-02-22 ENCOUNTER — Encounter (HOSPITAL_COMMUNITY): Payer: Self-pay | Admitting: Emergency Medicine

## 2020-02-22 ENCOUNTER — Other Ambulatory Visit: Payer: Self-pay

## 2020-02-22 ENCOUNTER — Emergency Department (HOSPITAL_COMMUNITY): Payer: Medicare PPO

## 2020-02-22 ENCOUNTER — Encounter (HOSPITAL_COMMUNITY)
Admission: EM | Disposition: A | Discharge status: Home Health | Payer: Self-pay | Source: Home / Self Care | Attending: Cardiovascular Disease

## 2020-02-22 DIAGNOSIS — E785 Hyperlipidemia, unspecified: Secondary | ICD-10-CM | POA: Diagnosis not present

## 2020-02-22 DIAGNOSIS — R0602 Shortness of breath: Secondary | ICD-10-CM | POA: Diagnosis not present

## 2020-02-22 DIAGNOSIS — K219 Gastro-esophageal reflux disease without esophagitis: Secondary | ICD-10-CM | POA: Diagnosis present

## 2020-02-22 DIAGNOSIS — I77810 Thoracic aortic ectasia: Secondary | ICD-10-CM

## 2020-02-22 DIAGNOSIS — R0789 Other chest pain: Secondary | ICD-10-CM | POA: Diagnosis not present

## 2020-02-22 DIAGNOSIS — E871 Hypo-osmolality and hyponatremia: Secondary | ICD-10-CM | POA: Diagnosis present

## 2020-02-22 DIAGNOSIS — R079 Chest pain, unspecified: Secondary | ICD-10-CM | POA: Diagnosis not present

## 2020-02-22 DIAGNOSIS — I2111 ST elevation (STEMI) myocardial infarction involving right coronary artery: Secondary | ICD-10-CM | POA: Diagnosis not present

## 2020-02-22 DIAGNOSIS — Z9861 Coronary angioplasty status: Secondary | ICD-10-CM

## 2020-02-22 DIAGNOSIS — Z20822 Contact with and (suspected) exposure to covid-19: Secondary | ICD-10-CM | POA: Diagnosis present

## 2020-02-22 DIAGNOSIS — I10 Essential (primary) hypertension: Secondary | ICD-10-CM | POA: Diagnosis present

## 2020-02-22 DIAGNOSIS — I2119 ST elevation (STEMI) myocardial infarction involving other coronary artery of inferior wall: Secondary | ICD-10-CM | POA: Diagnosis not present

## 2020-02-22 DIAGNOSIS — I2510 Atherosclerotic heart disease of native coronary artery without angina pectoris: Secondary | ICD-10-CM

## 2020-02-22 DIAGNOSIS — Z955 Presence of coronary angioplasty implant and graft: Secondary | ICD-10-CM

## 2020-02-22 DIAGNOSIS — Z8673 Personal history of transient ischemic attack (TIA), and cerebral infarction without residual deficits: Secondary | ICD-10-CM | POA: Diagnosis not present

## 2020-02-22 DIAGNOSIS — I351 Nonrheumatic aortic (valve) insufficiency: Secondary | ICD-10-CM | POA: Diagnosis not present

## 2020-02-22 DIAGNOSIS — R231 Pallor: Secondary | ICD-10-CM | POA: Diagnosis not present

## 2020-02-22 DIAGNOSIS — Z7902 Long term (current) use of antithrombotics/antiplatelets: Secondary | ICD-10-CM

## 2020-02-22 DIAGNOSIS — R001 Bradycardia, unspecified: Secondary | ICD-10-CM | POA: Diagnosis not present

## 2020-02-22 DIAGNOSIS — Z79899 Other long term (current) drug therapy: Secondary | ICD-10-CM | POA: Diagnosis not present

## 2020-02-22 DIAGNOSIS — J9811 Atelectasis: Secondary | ICD-10-CM | POA: Diagnosis not present

## 2020-02-22 HISTORY — PX: LEFT HEART CATH AND CORONARY ANGIOGRAPHY: CATH118249

## 2020-02-22 HISTORY — PX: CORONARY/GRAFT ACUTE MI REVASCULARIZATION: CATH118305

## 2020-02-22 LAB — BASIC METABOLIC PANEL
Anion gap: 10 (ref 5–15)
BUN: 16 mg/dL (ref 8–23)
CO2: 26 mmol/L (ref 22–32)
Calcium: 8.9 mg/dL (ref 8.9–10.3)
Chloride: 97 mmol/L — ABNORMAL LOW (ref 98–111)
Creatinine, Ser: 0.99 mg/dL (ref 0.61–1.24)
GFR calc Af Amer: >60 mL/min (ref >60)
GFR calc non Af Amer: >60 mL/min (ref >60)
Glucose, Bld: 151 mg/dL — ABNORMAL HIGH (ref 70–99)
Potassium: 3.7 mmol/L (ref 3.5–5.1)
Sodium: 133 mmol/L — ABNORMAL LOW (ref 135–145)

## 2020-02-22 LAB — CBC WITH DIFFERENTIAL/PLATELET
Abs Immature Granulocytes: 0.05 10*3/uL (ref 0.00–0.07)
Basophils Absolute: 0 10*3/uL (ref 0.0–0.1)
Basophils Relative: 0 %
Eosinophils Absolute: 0.1 10*3/uL (ref 0.0–0.5)
Eosinophils Relative: 1 %
HCT: 34.5 % — ABNORMAL LOW (ref 39.0–52.0)
Hemoglobin: 11.6 g/dL — ABNORMAL LOW (ref 13.0–17.0)
Immature Granulocytes: 1 %
Lymphocytes Relative: 28 %
Lymphs Abs: 2.6 10*3/uL (ref 0.7–4.0)
MCH: 33 pg (ref 26.0–34.0)
MCHC: 33.6 g/dL (ref 30.0–36.0)
MCV: 98 fL (ref 80.0–100.0)
Monocytes Absolute: 0.9 10*3/uL (ref 0.1–1.0)
Monocytes Relative: 10 %
Neutro Abs: 5.7 10*3/uL (ref 1.7–7.7)
Neutrophils Relative %: 60 %
Platelets: 250 10*3/uL (ref 150–400)
RBC: 3.52 MIL/uL — ABNORMAL LOW (ref 4.22–5.81)
RDW: 12.4 % (ref 11.5–15.5)
WBC: 9.5 10*3/uL (ref 4.0–10.5)
nRBC: 0 % (ref 0.0–0.2)

## 2020-02-22 LAB — CBC
HCT: 35.1 % — ABNORMAL LOW (ref 39.0–52.0)
Hemoglobin: 11.6 g/dL — ABNORMAL LOW (ref 13.0–17.0)
MCH: 32.4 pg (ref 26.0–34.0)
MCHC: 33 g/dL (ref 30.0–36.0)
MCV: 98 fL (ref 80.0–100.0)
Platelets: 262 10*3/uL (ref 150–400)
RBC: 3.58 MIL/uL — ABNORMAL LOW (ref 4.22–5.81)
RDW: 12.3 % (ref 11.5–15.5)
WBC: 9.3 10*3/uL (ref 4.0–10.5)
nRBC: 0 % (ref 0.0–0.2)

## 2020-02-22 LAB — TROPONIN I (HIGH SENSITIVITY)
Troponin I (High Sensitivity): 33 ng/L — ABNORMAL HIGH (ref <18)
Troponin I (High Sensitivity): 39 ng/L — ABNORMAL HIGH (ref <18)
Troponin I (High Sensitivity): >27000 ng/L (ref <18)

## 2020-02-22 LAB — POCT ACTIVATED CLOTTING TIME
Activated Clotting Time: 384 seconds
Activated Clotting Time: 791 seconds

## 2020-02-22 LAB — SARS CORONAVIRUS 2 BY RT PCR (HOSPITAL ORDER, PERFORMED IN ~~LOC~~ HOSPITAL LAB): SARS Coronavirus 2: NEGATIVE

## 2020-02-22 SURGERY — LEFT HEART CATH AND CORONARY ANGIOGRAPHY
Anesthesia: LOCAL

## 2020-02-22 MED ORDER — LIDOCAINE HCL (PF) 1 % IJ SOLN
INTRAMUSCULAR | Status: DC | PRN
  Administered 2020-02-22: 2 mL

## 2020-02-22 MED ORDER — IOHEXOL 350 MG/ML SOLN
INTRAVENOUS | Status: DC | PRN
  Administered 2020-02-22: 150 mL

## 2020-02-22 MED ORDER — HYDRALAZINE HCL 20 MG/ML IJ SOLN: 10.0000 mg | INTRAMUSCULAR | Status: AC | PRN

## 2020-02-22 MED ORDER — PANTOPRAZOLE SODIUM 40 MG PO TBEC
40.0000 mg | DELAYED_RELEASE_TABLET | Freq: Every day | ORAL | Status: DC
  Administered 2020-02-23 – 2020-02-25 (×3): 40 mg via ORAL
  Filled 2020-02-22 (×3): qty 1

## 2020-02-22 MED ORDER — HEPARIN SODIUM (PORCINE) 5000 UNIT/ML IJ SOLN
4000.0000 [IU] | Freq: Once | INTRAMUSCULAR | Status: AC
  Administered 2020-02-22: 4000 [IU] via INTRAVENOUS
  Filled 2020-02-22: qty 0.8

## 2020-02-22 MED ORDER — ATENOLOL 50 MG PO TABS
50.0000 mg | ORAL_TABLET | Freq: Two times a day (BID) | ORAL | Status: DC
  Administered 2020-02-23 (×2): 50 mg via ORAL
  Filled 2020-02-22 (×3): qty 1
  Filled 2020-02-22 (×2): qty 2

## 2020-02-22 MED ORDER — ONDANSETRON HCL 4 MG/2ML IJ SOLN: 4.0000 mg | Freq: Four times a day (QID) | INTRAMUSCULAR | Status: DC | PRN

## 2020-02-22 MED ORDER — ASPIRIN 81 MG PO CHEW
81.0000 mg | CHEWABLE_TABLET | Freq: Every day | ORAL | Status: DC
  Administered 2020-02-23 – 2020-02-25 (×3): 81 mg via ORAL
  Filled 2020-02-22 (×3): qty 1

## 2020-02-22 MED ORDER — CYCLOBENZAPRINE HCL 5 MG PO TABS
5.0000 mg | ORAL_TABLET | Freq: Three times a day (TID) | ORAL | Status: DC | PRN
  Filled 2020-02-22: qty 1

## 2020-02-22 MED ORDER — VERAPAMIL HCL 2.5 MG/ML IV SOLN
INTRAVENOUS | Status: AC
  Filled 2020-02-22: qty 2

## 2020-02-22 MED ORDER — TIROFIBAN HCL IV 12.5 MG/250 ML
0.0750 ug/kg/min | INTRAVENOUS | Status: DC
  Administered 2020-02-22: 0.075 ug/kg/min via INTRAVENOUS
  Filled 2020-02-22 (×2): qty 250

## 2020-02-22 MED ORDER — SODIUM CHLORIDE 0.9% FLUSH
3.0000 mL | Freq: Two times a day (BID) | INTRAVENOUS | Status: DC
  Administered 2020-02-22 – 2020-02-25 (×6): 3 mL via INTRAVENOUS

## 2020-02-22 MED ORDER — SODIUM CHLORIDE 0.9% FLUSH: 3.0000 mL | Freq: Once | INTRAVENOUS | Status: DC

## 2020-02-22 MED ORDER — LOSARTAN POTASSIUM 50 MG PO TABS
25.0000 mg | ORAL_TABLET | Freq: Every day | ORAL | Status: DC
  Administered 2020-02-23 – 2020-02-25 (×2): 25 mg via ORAL
  Filled 2020-02-22 (×3): qty 1

## 2020-02-22 MED ORDER — MIDAZOLAM HCL 2 MG/2ML IJ SOLN
INTRAMUSCULAR | Status: DC | PRN
  Administered 2020-02-22 (×2): 1 mg via INTRAVENOUS

## 2020-02-22 MED ORDER — FENTANYL CITRATE (PF) 100 MCG/2ML IJ SOLN
INTRAMUSCULAR | Status: DC | PRN
  Administered 2020-02-22 (×2): 25 ug via INTRAVENOUS

## 2020-02-22 MED ORDER — TIROFIBAN (AGGRASTAT) BOLUS VIA INFUSION
INTRAVENOUS | Status: DC | PRN
  Administered 2020-02-22: 1722.5 ug via INTRAVENOUS

## 2020-02-22 MED ORDER — COQ10 200 MG PO CAPS: 200.0000 mg | ORAL_CAPSULE | Freq: Every day | ORAL | Status: DC

## 2020-02-22 MED ORDER — NITROGLYCERIN 0.4 MG SL SUBL
0.4000 mg | SUBLINGUAL_TABLET | SUBLINGUAL | Status: DC | PRN
  Filled 2020-02-22: qty 1

## 2020-02-22 MED ORDER — SODIUM CHLORIDE 0.9 % IV SOLN: INTRAVENOUS | Status: AC

## 2020-02-22 MED ORDER — NITROGLYCERIN 1 MG/10 ML FOR IR/CATH LAB
INTRA_ARTERIAL | Status: AC
  Filled 2020-02-22: qty 10

## 2020-02-22 MED ORDER — LABETALOL HCL 5 MG/ML IV SOLN: 10.0000 mg | INTRAVENOUS | Status: AC | PRN

## 2020-02-22 MED ORDER — TIROFIBAN HCL IN NACL 5-0.9 MG/100ML-% IV SOLN
INTRAVENOUS | Status: AC
  Filled 2020-02-22: qty 100

## 2020-02-22 MED ORDER — MIDAZOLAM HCL 2 MG/2ML IJ SOLN
INTRAMUSCULAR | Status: AC
  Filled 2020-02-22: qty 2

## 2020-02-22 MED ORDER — HEPARIN (PORCINE) IN NACL 1000-0.9 UT/500ML-% IV SOLN
INTRAVENOUS | Status: AC
  Filled 2020-02-22: qty 1000

## 2020-02-22 MED ORDER — IOHEXOL 350 MG/ML SOLN
INTRAVENOUS | Status: AC
  Filled 2020-02-22: qty 1

## 2020-02-22 MED ORDER — ROSUVASTATIN CALCIUM 20 MG PO TABS
40.0000 mg | ORAL_TABLET | Freq: Every day | ORAL | Status: DC
  Administered 2020-02-23 – 2020-02-25 (×3): 40 mg via ORAL
  Filled 2020-02-22: qty 1
  Filled 2020-02-22 (×3): qty 2

## 2020-02-22 MED ORDER — NITROGLYCERIN 0.4 MG SL SUBL: 0.4000 mg | SUBLINGUAL_TABLET | SUBLINGUAL | Status: DC | PRN

## 2020-02-22 MED ORDER — TIROFIBAN HCL IN NACL 5-0.9 MG/100ML-% IV SOLN
INTRAVENOUS | Status: AC | PRN
  Administered 2020-02-22: 0.075 ug/kg/min via INTRAVENOUS

## 2020-02-22 MED ORDER — FENTANYL CITRATE (PF) 100 MCG/2ML IJ SOLN
INTRAMUSCULAR | Status: AC
  Filled 2020-02-22: qty 2

## 2020-02-22 MED ORDER — ACETAMINOPHEN 325 MG PO TABS
650.0000 mg | ORAL_TABLET | ORAL | Status: DC | PRN
  Administered 2020-02-23 (×3): 650 mg via ORAL
  Filled 2020-02-22 (×3): qty 2

## 2020-02-22 MED ORDER — HEPARIN SODIUM (PORCINE) 5000 UNIT/ML IJ SOLN
INTRAMUSCULAR | Status: AC
  Filled 2020-02-22: qty 1

## 2020-02-22 MED ORDER — HYDROCODONE-ACETAMINOPHEN 10-325 MG PO TABS
1.0000 | ORAL_TABLET | Freq: Three times a day (TID) | ORAL | Status: DC
  Administered 2020-02-22 – 2020-02-25 (×8): 1 via ORAL
  Filled 2020-02-22 (×8): qty 1

## 2020-02-22 MED ORDER — VERAPAMIL HCL 2.5 MG/ML IV SOLN
INTRAVENOUS | Status: DC | PRN
  Administered 2020-02-22: 10 mL via INTRA_ARTERIAL

## 2020-02-22 MED ORDER — MORPHINE SULFATE (PF) 2 MG/ML IV SOLN
2.0000 mg | Freq: Once | INTRAVENOUS | Status: AC
  Administered 2020-02-22: 2 mg via INTRAVENOUS
  Filled 2020-02-22: qty 1

## 2020-02-22 MED ORDER — TIROFIBAN HCL IN NACL 5-0.9 MG/100ML-% IV SOLN
0.0750 ug/kg/min | INTRAVENOUS | Status: DC
Start: 2020-02-22 — End: 2020-02-22

## 2020-02-22 MED ORDER — FLUTICASONE PROPIONATE 50 MCG/ACT NA SUSP
1.0000 | Freq: Every day | NASAL | Status: DC
  Administered 2020-02-23 – 2020-02-24 (×2): 1 via NASAL
  Filled 2020-02-22: qty 16

## 2020-02-22 MED ORDER — LIDOCAINE HCL (PF) 1 % IJ SOLN
INTRAMUSCULAR | Status: AC
  Filled 2020-02-22: qty 30

## 2020-02-22 MED ORDER — SODIUM CHLORIDE 0.9 % IV SOLN: 250.0000 mL | INTRAVENOUS | Status: DC | PRN

## 2020-02-22 MED ORDER — HEPARIN SODIUM (PORCINE) 1000 UNIT/ML IJ SOLN
INTRAMUSCULAR | Status: DC | PRN
  Administered 2020-02-22: 6000 [IU] via INTRAVENOUS

## 2020-02-22 MED ORDER — HEPARIN (PORCINE) IN NACL 1000-0.9 UT/500ML-% IV SOLN
INTRAVENOUS | Status: DC | PRN
  Administered 2020-02-22 (×2): 500 mL

## 2020-02-22 MED ORDER — POLYETHYLENE GLYCOL 3350 17 G PO PACK
17.0000 g | PACK | Freq: Once | ORAL | Status: DC
  Filled 2020-02-22: qty 1

## 2020-02-22 MED ORDER — SODIUM CHLORIDE 0.9% FLUSH: 3.0000 mL | INTRAVENOUS | Status: DC | PRN

## 2020-02-22 MED ORDER — MORPHINE SULFATE (PF) 4 MG/ML IV SOLN
4.0000 mg | Freq: Once | INTRAVENOUS | Status: AC
  Administered 2020-02-22: 4 mg via INTRAVENOUS
  Filled 2020-02-22: qty 1

## 2020-02-22 SURGICAL SUPPLY — 18 items
BALLN SAPPHIRE 2.5X12 (BALLOONS) ×2
BALLOON SAPPHIRE 2.5X12 (BALLOONS) ×1 IMPLANT
CATH 5FR JL3.5 JR4 ANG PIG MP (CATHETERS) ×2 IMPLANT
CATH LAUNCHER 6FR AL.75 (CATHETERS) ×2 IMPLANT
CATH LAUNCHER 6FR JR4 (CATHETERS) ×2 IMPLANT
CATH VISTA GUIDE 6FR XBLAD3.5 (CATHETERS) ×2 IMPLANT
DEVICE RAD COMP TR BAND LRG (VASCULAR PRODUCTS) ×2 IMPLANT
GLIDESHEATH SLEND SS 6F .021 (SHEATH) ×2 IMPLANT
GUIDELINER 6F (CATHETERS) ×2 IMPLANT
GUIDEWIRE INQWIRE 1.5J.035X260 (WIRE) ×1 IMPLANT
INQWIRE 1.5J .035X260CM (WIRE) ×2
KIT ENCORE 26 ADVANTAGE (KITS) ×2 IMPLANT
KIT HEART LEFT (KITS) ×2 IMPLANT
PACK CARDIAC CATHETERIZATION (CUSTOM PROCEDURE TRAY) ×2 IMPLANT
TRANSDUCER W/STOPCOCK (MISCELLANEOUS) ×2 IMPLANT
TUBING CIL FLEX 10 FLL-RA (TUBING) ×2 IMPLANT
WIRE COUGAR XT STRL 190CM (WIRE) ×2 IMPLANT
WIRE HI TORQ WHISPER MS 190CM (WIRE) ×4 IMPLANT

## 2020-02-22 NOTE — H&P (Signed)
Cardiology Admission History and Physical:   Patient ID: Christopher Ferguson MRN: [id number]; DOB: [birth year]   Admission date: 02/22/2020  Primary Care Provider: Kristen Loader, FNP [facility] Cardiologist: Candee Furbish, MD  [facility] Electrophysiologist:  None   Chief Complaint:  Chest pain  Patient Profile:   Christopher Ferguson is a 84 y.o. male with history of chronic back pain, CAD s/p coronary stenting in 2001 and 2009, chronically occluded Circumflex, prior CVA, HLD, HTN, RBBB and GERD who presented to the ED today with c/o chest pain and dyspnea for 2-3 hours.   History of Present Illness:    Christopher Ferguson is a 84 y.o. male with history of chronic back pain, CAD s/p coronary stenting in 2001 and 2009, chronically occluded Circumflex, prior CVA, HLD, HTN, RBBB and GERD who presented to the ED today with c/o chest pain and dyspnea for 2-3 hours. The pain radiates from his back to his chest and started around 11 am today. The pain is similar to his prior anginal pain. First EKG in the ED with no clear ST elevation. Repeat EKG with inferior ST elevation. Code STEMI called. Pt with ongoing chest pain. Heparin 4000 x 1 in the ED. ASA per EMS.    Past Medical History:  Diagnosis Date   AR (allergic rhinitis)    CAD S/P percutaneous coronary angioplasty    3 stents on '01, one stent in '09, known CTO FCX   Chronic back pain    Constipation, unspecified 06/09/2009   CVA, old, alterations of sensations    Degeneration of cervical intervertebral disc    Degeneration of lumbar or lumbosacral intervertebral disc 06/10/2007   Depression    Diverticulitis of colon without hemorrhage    Dyslipidemia    Epidural abscess    GERD (gastroesophageal reflux disease)    Hypertension    RBBB with left anterior fascicular block    Spinal stenosis    HAD SURGERY    Past Surgical History:  Procedure Laterality Date   BACK SURGERY     LAMINECTOMY  2009   OF C6-C7   LUMBAR  DISC SURGERY     MOUTH SURGERY       Medications Prior to Admission: Prior to Admission medications   Medication Sig Start Date End Date Taking? Authorizing Provider  Ascorbic Acid (VITAMIN C) 1000 MG tablet Take 1,000 mg by mouth daily.    [provider]  atenolol (TENORMIN) 50 MG tablet TAKE 1 TABLET TWICE A DAY 04/07/19   Jerline Pain, MD  BELBUCA 150 MCG FILM Take 150 mcg by mouth 2 (two) times daily. 12/28/19   [provider]  buprenorphine (BUTRANS - DOSED MCG/HR) 20 MCG/HR PTWK patch Place 20 mcg onto the skin every 7 (seven) days.     [provider]  Ca Carbonate-Mag Hydroxide (ROLAIDS PO) Take 1 tablet by mouth daily as needed. (for heartburn)    [provider]  Cholecalciferol (VITAMIN D3) 10000 units TABS Take 1,000 Units by mouth daily.    [provider]  clopidogrel (PLAVIX) 75 MG tablet Take 1 tablet (75 mg total) by mouth daily. 09/10/17   Jerline Pain, MD  Coenzyme Q10 (COQ10) 200 MG CAPS Take 200 mg by mouth daily.    [provider]  cyclobenzaprine (FLEXERIL) 5 MG tablet Take 5 mg by mouth as directed.    [provider]  dexlansoprazole (DEXILANT) 60 MG capsule Take 60 mg by mouth daily.    [provider]  dextromethorphan-guaiFENesin (MUCINEX DM) 30-600 MG 12hr tablet Take 1 tablet by mouth every 12 (twelve) hours as needed for congestion.    [provider]  DULoxetine (CYMBALTA) 20 MG capsule Take 10 mg by mouth daily.    [provider]  famotidine (PEPCID) 40 MG tablet Take 40 mg by mouth at bedtime.    [provider]  Fexofenadine HCl (ALLEGRA PO) Take 1 tablet by mouth as directed.    [provider]  Flaxseed, Linseed, (FLAXSEED OIL) 1000 MG CAPS Take 1,000 mg by mouth daily.    [provider]  HYDROcodone-acetaminophen (NORCO) 10-325 MG tablet Take 1 tablet by mouth every 6 (six) hours as needed.    [provider]  losartan  (COZAAR) 25 MG tablet Take 1 tablet (25 mg total) by mouth daily. 09/10/17   Jerline Pain, MD  LYCOPENE PO Take 1 tablet by mouth daily.    [provider]  mometasone (NASONEX) 50 MCG/ACT nasal spray Place 2 sprays into the nose daily.    [provider]  nitroGLYCERIN (NITROSTAT) 0.4 MG SL tablet Place 0.4 mg under the tongue every 5 (five) minutes as needed for chest pain.    [provider]  Omega-3 Fatty Acids (FISH OIL PO) Take 1 capsule by mouth daily.    [provider]  OVER THE COUNTER MEDICATION Take 1 capsule by mouth 2 (two) times daily. OMEAGA XL    [provider]  polyethylene glycol powder (MIRALAX) powder Take 1 Container by mouth once.    [provider]  rosuvastatin (CRESTOR) 40 MG tablet Take 1 tablet (40 mg total) by mouth daily. 09/10/17   Jerline Pain, MD  tiZANidine (ZANAFLEX) 2 MG tablet Take 2 mg by mouth every 8 (eight) hours. 08/28/17   [provider]     Allergies:    Allergies  Allergen Reactions   Codeine Nausea Only   Lidoderm [Lidocaine]    Paxil [Paroxetine Hcl]     Social History:   Social History   Socioeconomic History   Marital status: Married    Spouse name: Not on file   Number of children: Not on file   Years of education: Not on file   Highest education level: Not on file  Occupational History   Not on file  Tobacco Use   Smoking status: Never Smoker   Smokeless tobacco: Never Used  Substance and Sexual Activity   Alcohol use: Not on file   Drug use: Not on file   Sexual activity: Not on file  Other Topics Concern   Not on file  Social History Narrative   Not on file   Social Determinants of Health   Financial Resource Strain:    Difficulty of Paying Living Expenses:   Food Insecurity:    Worried About [place] in the Last Year:    Arboriculturist in the Last Year:   Transportation Needs:    Film/video editor (Medical):     Lack of Transportation (Non-Medical):   Physical Activity:    Days of Exercise per Week:    Minutes of Exercise per Session:   Stress:    Feeling of Stress :   Social Connections:    Frequency of Communication with Friends and Family:    Frequency of Social Gatherings with Friends and Family:    Attends Religious Services:    Active Member of Clubs or Organizations:    Attends [employer]  or Organization Meetings:    Marital Status:   Intimate Partner Violence:    Fear of Current or Ex-Partner:    Emotionally Abused:    Physically Abused:    Sexually Abused:     Family History:  Father: HTN The patient's family history is not on file.    ROS:  Please see the history of present illness.  All other ROS reviewed and negative.     Physical Exam/Data:   Vitals:   02/22/20 1400 02/22/20 1415 02/22/20 1430 02/22/20 1445  BP: (!) 163/74 (!) 164/71 (!) 160/60 (!) 170/70  Pulse: (!) 50 (!) 51 (!) 54 (!) 54  Resp: 16 18 16 18   Temp:      TempSrc:      SpO2: 100% 100% 100% 100%  Weight:      Height:       No intake or output data in the 24 hours ending 02/22/20 1501 Last 3 Weights 02/22/2020 01/18/2020 11/23/2019  Weight (lbs) 151 lb 14.4 oz 152 lb 154 lb  Weight (kg) 68.9 kg 68.947 kg 69.854 kg     Body mass index is 23.1 kg/m.  General:  Well nourished, well developed, appears uncomfortable HEENT: normal Lymph: no adenopathy Neck: no  JVD Endocrine:  No thryomegaly Vascular: No carotid bruits Cardiac:  normal S1, S2; RRR; no murmur  Lungs:  clear to auscultation bilaterally, no wheezing, rhonchi or rales  Abd: soft, nontender, no hepatomegaly  Ext: no LE edema Musculoskeletal:  No deformities, BUE and BLE strength normal and equal Skin: warm and dry  Neuro:  CNs 2-12 intact, no focal abnormalities noted Psych:  Normal affect    EKG:  The ECG that was done was personally reviewed and demonstrates sinus, RBBB, LAFB, 2 mm ST elevation inferior leads  Relevant CV  Studies:   Laboratory Data:  High Sensitivity Troponin:   Recent Labs  Lab 02/22/20 1308  TROPONINIHS 33*      Chemistry Recent Labs  Lab 02/22/20 1308  NA 133*  K 3.7  CL 97*  CO2 26  GLUCOSE 151*  BUN 16  CREATININE 0.99  CALCIUM 8.9  GFRNONAA >60  GFRAA >60  ANIONGAP 10    No results for input(s): PROT, ALBUMIN, AST, ALT, ALKPHOS, BILITOT in the last 168 hours. Hematology Recent Labs  Lab 02/22/20 1308  WBC 9.5   9.3  RBC 3.52*   3.58*  HGB 11.6*   11.6*  HCT 34.5*   35.1*  MCV 98.0   98.0  MCH 33.0   32.4  MCHC 33.6   33.0  RDW 12.4   12.3  PLT 250   262   BNPNo results for input(s): BNP, PROBNP in the last 168 hours.  DDimer No results for input(s): DDIMER in the last 168 hours.   Radiology/Studies:  DG Chest 2 View  Result Date: 02/22/2020 CLINICAL DATA:  Onset chest pain and shortness of breath this morning. EXAM: CHEST - 2 VIEW COMPARISON:  None. FINDINGS: Asymmetric elevation of the right hemidiaphragm relative to the left noted. There is minimal left basilar atelectasis. Lungs otherwise clear. Heart size is normal. Aortic atherosclerosis noted. No pneumothorax or pleural fluid. No acute or focal bony abnormality. IMPRESSION: No acute disease. Aortic Atherosclerosis (ICD10-I70.0). Electronically Signed   By: Inge Rise M.D.   On: 02/22/2020 13:38   {  Assessment and Plan:   1. Acute inferior STEMI: Will plan emergent cardiac cath with probable PCI. Further plans to follow after cath.  Severity of Illness: The appropriate patient status for this patient is INPATIENT. Inpatient status is judged to be reasonable and necessary in order to provide the required intensity of service to ensure the patient's safety. The patient's presenting symptoms, physical exam findings, and initial radiographic and laboratory data in the context of their chronic comorbidities is felt to place them at high risk for further clinical deterioration. Furthermore, it is  not anticipated that the patient will be medically stable for discharge from the hospital within 2 midnights of admission. The following factors support the patient status of inpatient.   " The patient's presenting symptoms include chest pain. " The worrisome physical exam findings include none " The initial radiographic and laboratory data are worrisome because of EKG with ST elevation " The chronic co-morbidities include CAD, HTN, HLD, prior CVA   * I certify that at the point of admission it is my clinical judgment that the patient will require inpatient hospital care spanning beyond 2 midnights from the point of admission due to high intensity of service, high risk for further deterioration and high frequency of surveillance required.*    For questions or updates, please contact [place] Please consult [email or web address] for contact info under     Signed, Lauree Chandler, MD  02/22/2020 3:01 PM

## 2020-02-22 NOTE — ED Triage Notes (Signed)
Pt brought to ED by [facility] from home for c/o cp and sob. BP 130/80, HR 50-60, SPO2 98% on RA.  1.2 mg nitro sl and 324 mg ASA given by EMS pta to ed. Pt is AO x 4 NAD noticed. Pt placed on 2 L O2 [place] for comfort only.

## 2020-02-22 NOTE — ED Provider Notes (Signed)
[facility] Provider Note   CSN: [id number] Arrival date & time: 02/22/20  1247     History Chief Complaint  Patient presents with  . Chest Pain    Christopher Ferguson is a 84 y.o. male.  Patient states that he started having severe chest pain going into the back around 11 AM.  No shortness of breath some sweating  The history is provided by the patient. No language interpreter was used.  Chest Pain Pain location:  Substernal area Pain quality: pressure   Pain radiates to:  Upper back Pain severity:  Severe Onset quality:  Sudden Timing:  Constant Progression:  Worsening Chronicity:  New Context: not breathing   Relieved by:  Nothing Worsened by:  Nothing Ineffective treatments: Nitroglycerin and aspirin. Associated symptoms: no abdominal pain, no back pain, no cough, no fatigue and no headache        Past Medical History:  Diagnosis Date  . AR (allergic rhinitis)   . CAD S/P percutaneous coronary angioplasty    3 stents on '01, one stent in '09, known CTO FCX  . Chronic back pain   . Constipation, unspecified 06/09/2009  . CVA, old, alterations of sensations   . Degeneration of cervical intervertebral disc   . Degeneration of lumbar or lumbosacral intervertebral disc 06/10/2007  . Depression   . Diverticulitis of colon without hemorrhage   . Dyslipidemia   . Epidural abscess   . GERD (gastroesophageal reflux disease)   . Hypertension   . RBBB with left anterior fascicular block   . Spinal stenosis    HAD SURGERY    Patient Active Problem List   Diagnosis Date Noted  . Pre-operative cardiovascular examination 08/11/2018  . CAD S/P percutaneous coronary angioplasty   . Dyslipidemia   . Hypertension   . Spinal stenosis   . RBBB with left anterior fascicular block   . GERD (gastroesophageal reflux disease)     Past Surgical History:  Procedure Laterality Date  . BACK SURGERY    . LAMINECTOMY  2009   OF C6-C7  .  LUMBAR DISC SURGERY    . MOUTH SURGERY         No family history on file.  Social History   Tobacco Use  . Smoking status: Never Smoker  . Smokeless tobacco: Never Used  Substance Use Topics  . Alcohol use: Not on file  . Drug use: Not on file    Home Medications Prior to Admission medications   Medication Sig Start Date End Date Taking? Authorizing Provider  Ascorbic Acid (VITAMIN C) 1000 MG tablet Take 1,000 mg by mouth daily.    [provider]  atenolol (TENORMIN) 50 MG tablet TAKE 1 TABLET TWICE A DAY 04/07/19   Jerline Pain, MD  BELBUCA 150 MCG FILM Take 150 mcg by mouth 2 (two) times daily. 12/28/19   [provider]  buprenorphine (BUTRANS - DOSED MCG/HR) 20 MCG/HR PTWK patch Place 20 mcg onto the skin every 7 (seven) days.     [provider]  Ca Carbonate-Mag Hydroxide (ROLAIDS PO) Take 1 tablet by mouth daily as needed. (for heartburn)    [provider]  Cholecalciferol (VITAMIN D3) 10000 units TABS Take 1,000 Units by mouth daily.    [provider]  clopidogrel (PLAVIX) 75 MG tablet Take 1 tablet (75 mg total) by mouth daily. 09/10/17   Jerline Pain, MD  Coenzyme Q10 (COQ10) 200 MG CAPS Take 200 mg by mouth  daily.    [provider]  cyclobenzaprine (FLEXERIL) 5 MG tablet Take 5 mg by mouth as directed.    [provider]  dexlansoprazole (DEXILANT) 60 MG capsule Take 60 mg by mouth daily.    [provider]  dextromethorphan-guaiFENesin (MUCINEX DM) 30-600 MG 12hr tablet Take 1 tablet by mouth every 12 (twelve) hours as needed for congestion.    [provider]  DULoxetine (CYMBALTA) 20 MG capsule Take 10 mg by mouth daily.    [provider]  famotidine (PEPCID) 40 MG tablet Take 40 mg by mouth at bedtime.    [provider]  Fexofenadine HCl (ALLEGRA PO) Take 1 tablet by mouth as directed.    [provider]  Flaxseed, Linseed, (FLAXSEED OIL) 1000 MG CAPS  Take 1,000 mg by mouth daily.    [provider]  HYDROcodone-acetaminophen (NORCO) 10-325 MG tablet Take 1 tablet by mouth every 6 (six) hours as needed.    [provider]  losartan (COZAAR) 25 MG tablet Take 1 tablet (25 mg total) by mouth daily. 09/10/17   Jerline Pain, MD  LYCOPENE PO Take 1 tablet by mouth daily.    [provider]  mometasone (NASONEX) 50 MCG/ACT nasal spray Place 2 sprays into the nose daily.    [provider]  nitroGLYCERIN (NITROSTAT) 0.4 MG SL tablet Place 0.4 mg under the tongue every 5 (five) minutes as needed for chest pain.    [provider]  Omega-3 Fatty Acids (FISH OIL PO) Take 1 capsule by mouth daily.    [provider]  OVER THE COUNTER MEDICATION Take 1 capsule by mouth 2 (two) times daily. OMEAGA XL    [provider]  polyethylene glycol powder (MIRALAX) powder Take 1 Container by mouth once.    [provider]  rosuvastatin (CRESTOR) 40 MG tablet Take 1 tablet (40 mg total) by mouth daily. 09/10/17   Jerline Pain, MD  tiZANidine (ZANAFLEX) 2 MG tablet Take 2 mg by mouth every 8 (eight) hours. 08/28/17   [provider]    Allergies    Codeine, Lidoderm [lidocaine], and Paxil [paroxetine hcl]  Review of Systems   Review of Systems  Constitutional: Negative for appetite change and fatigue.  HENT: Negative for congestion, ear discharge and sinus pressure.   Eyes: Negative for discharge.  Respiratory: Negative for cough.   Cardiovascular: Positive for chest pain.  Gastrointestinal: Negative for abdominal pain and diarrhea.  Genitourinary: Negative for frequency and hematuria.  Musculoskeletal: Negative for back pain.  Skin: Negative for rash.  Neurological: Negative for seizures and headaches.  Psychiatric/Behavioral: Negative for hallucinations.    Physical Exam Updated Vital Signs BP (!) 166/65   Pulse (!) 49   Temp 97.9 F (36.6 C) (Oral)   Resp (!) 24    Ht 5\' 8"  (1.727 m)   Wt 68.9 kg   SpO2 100%   BMI 23.10 kg/m   Physical Exam Vitals and nursing note reviewed.  Constitutional:      Appearance: He is well-developed.  HENT:     Head: Normocephalic.     Nose: Nose normal.  Eyes:     General: No scleral icterus.    Conjunctiva/sclera: Conjunctivae normal.  Neck:     Thyroid: No thyromegaly.  Cardiovascular:     Rate and Rhythm: Normal rate and regular rhythm.     Heart sounds: No murmur heard.  No friction rub. No gallop.   Pulmonary:  Breath sounds: No stridor. No wheezing or rales.  Chest:     Chest wall: No tenderness.  Abdominal:     General: There is no distension.     Tenderness: There is no abdominal tenderness. There is no rebound.  Musculoskeletal:        General: Normal range of motion.     Cervical back: Neck supple.  Lymphadenopathy:     Cervical: No cervical adenopathy.  Skin:    Findings: No erythema or rash.  Neurological:     Mental Status: He is alert and oriented to person, place, and time.     Motor: No abnormal muscle tone.     Coordination: Coordination normal.  Psychiatric:        Behavior: Behavior normal.     ED Results / Procedures / Treatments   Labs (all labs ordered are listed, but only abnormal results are displayed) Labs Reviewed  BASIC METABOLIC PANEL - Abnormal; Notable for the following components:      Result Value   Sodium 133 (*)    Chloride 97 (*)    Glucose, Bld 151 (*)    All other components within normal limits  CBC - Abnormal; Notable for the following components:   RBC 3.58 (*)    Hemoglobin 11.6 (*)    HCT 35.1 (*)    All other components within normal limits  TROPONIN I (HIGH SENSITIVITY) - Abnormal; Notable for the following components:   Troponin I (High Sensitivity) 33 (*)    All other components within normal limits  SARS CORONAVIRUS 2 BY RT PCR (HOSPITAL ORDER, [facility])  DIFFERENTIAL  CBC WITH DIFFERENTIAL/PLATELET   TROPONIN I (HIGH SENSITIVITY)    EKG EKG Interpretation  Date/Time:  Monday February 22 2020 12:54:25 EDT Ventricular Rate:  52 PR Interval:    QRS Duration: 145 QT Interval:  484 QTC Calculation: 451 R Axis:   -58 Text Interpretation: Sinus rhythm RBBB and LAFB Left ventricular hypertrophy Confirmed by Milton Ferguson [phone]) on 02/22/2020 1:18:31 PM Also confirmed by Milton Ferguson [phone]), editor Hattie Perch ([id number])  on 02/22/2020 1:19:12 PM   Radiology DG Chest 2 View  Result Date: 02/22/2020 CLINICAL DATA:  Onset chest pain and shortness of breath this morning. EXAM: CHEST - 2 VIEW COMPARISON:  None. FINDINGS: Asymmetric elevation of the right hemidiaphragm relative to the left noted. There is minimal left basilar atelectasis. Lungs otherwise clear. Heart size is normal. Aortic atherosclerosis noted. No pneumothorax or pleural fluid. No acute or focal bony abnormality. IMPRESSION: No acute disease. Aortic Atherosclerosis (ICD10-I70.0). Electronically Signed   By: Inge Rise M.D.   On: 02/22/2020 13:38    Procedures Procedures (including critical care time)  Medications Ordered in ED Medications  sodium chloride flush (NS) 0.9 % injection 3 mL (3 mLs Intravenous Not Given 02/22/20 1312)  nitroGLYCERIN (NITROSTAT) SL tablet 0.4 mg (has no administration in time range)  morphine 2 MG/ML injection 2 mg (2 mg Intravenous Given 02/22/20 1352)  morphine 4 MG/ML injection 4 mg (4 mg Intravenous Given 02/22/20 1413)    ED Course  I have reviewed the triage vital signs and the nursing notes.  Pertinent labs & imaging results that were available during my care of the patient were reviewed by me and considered in my medical decision making (see chart for details).    CRITICAL CARE Performed by: Milton Ferguson Total critical care time: 40 minutes Critical care time was exclusive of separately billable procedures and  treating other patients. Critical care was necessary to treat  or prevent imminent or life-threatening deterioration. Critical care was time spent personally by me on the following activities: development of treatment plan with patient and/or surrogate as well as nursing, discussions with consultants, evaluation of patient's response to treatment, examination of patient, obtaining history from patient or surrogate, ordering and performing treatments and interventions, ordering and review of laboratory studies, ordering and review of radiographic studies, pulse oximetry and re-evaluation of patient's condition. Patient with chest pain since 11 AM.  First EKG did not show STEMI.  Patient continued with significant pain.  Second EKG suggests inferior STEMI.  Cardiology at bedside will take care of patient MDM Rules/Calculators/A&P                          Patient with significant chest pain consistent with NSTEMI will be taken care of by cardiology        This patient presents to the ED for concern of chest pain, this involves an extensive number of treatment options, and is a complaint that carries with it a high risk of complications and morbidity.  The differential diagnosis includes MI PE   Lab Tests:   I Ordered, reviewed, and interpreted labs, which included CBC chemistries troponin.  Troponin mildly elevated and he is mildly anemic  Medicines ordered:   I ordered medication heparin for MI  Imaging Studies ordered:   I ordered imaging studies which included chest x-ray and  I independently visualized and interpreted imaging which showed unremarkable  Additional history obtained:   Additional history obtained from wife  Previous records obtained and reviewed.  Consultations Obtained:   I consulted cardiology and discussed lab and imaging findings  Reevaluation:  After the interventions stated above, I reevaluated the patient and found mild improvement  Critical Interventions:  . Take to the Cath Lab for STEMI  Final  Clinical Impression(s) / ED Diagnoses Final diagnoses:  ST elevation myocardial infarction involving right coronary artery [facility])    Rx / DC Orders ED Discharge Orders    None       Milton Ferguson, MD 02/22/20 1451

## 2020-02-23 ENCOUNTER — Encounter (HOSPITAL_COMMUNITY): Payer: Self-pay | Admitting: Cardiovascular Disease

## 2020-02-23 ENCOUNTER — Inpatient Hospital Stay (HOSPITAL_COMMUNITY): Payer: Medicare PPO

## 2020-02-23 DIAGNOSIS — I351 Nonrheumatic aortic (valve) insufficiency: Secondary | ICD-10-CM

## 2020-02-23 LAB — CBC
HCT: 34 % — ABNORMAL LOW (ref 39.0–52.0)
Hemoglobin: 11.5 g/dL — ABNORMAL LOW (ref 13.0–17.0)
MCH: 32.7 pg (ref 26.0–34.0)
MCHC: 33.8 g/dL (ref 30.0–36.0)
MCV: 96.6 fL (ref 80.0–100.0)
Platelets: 250 10*3/uL (ref 150–400)
RBC: 3.52 MIL/uL — ABNORMAL LOW (ref 4.22–5.81)
RDW: 12.3 % (ref 11.5–15.5)
WBC: 11.9 10*3/uL — ABNORMAL HIGH (ref 4.0–10.5)
nRBC: 0 % (ref 0.0–0.2)

## 2020-02-23 LAB — BASIC METABOLIC PANEL
Anion gap: 10 (ref 5–15)
BUN: 13 mg/dL (ref 8–23)
CO2: 24 mmol/L (ref 22–32)
Calcium: 9.2 mg/dL (ref 8.9–10.3)
Chloride: 98 mmol/L (ref 98–111)
Creatinine, Ser: 0.79 mg/dL (ref 0.61–1.24)
GFR calc Af Amer: >60 mL/min (ref >60)
GFR calc non Af Amer: >60 mL/min (ref >60)
Glucose, Bld: 115 mg/dL — ABNORMAL HIGH (ref 70–99)
Potassium: 4.2 mmol/L (ref 3.5–5.1)
Sodium: 132 mmol/L — ABNORMAL LOW (ref 135–145)

## 2020-02-23 LAB — ECHOCARDIOGRAM COMPLETE
Height: 68 in
Weight: 2430.35 oz

## 2020-02-23 LAB — TROPONIN I (HIGH SENSITIVITY)
Troponin I (High Sensitivity): >27000 ng/L (ref <18)
Troponin I (High Sensitivity): >27000 ng/L (ref <18)

## 2020-02-23 MED ORDER — CALCIUM CARBONATE ANTACID 500 MG PO CHEW
1.0000 | CHEWABLE_TABLET | Freq: Three times a day (TID) | ORAL | Status: DC | PRN
  Administered 2020-02-23 – 2020-02-24 (×5): 200 mg via ORAL
  Filled 2020-02-23 (×5): qty 1

## 2020-02-23 MED ORDER — TIROFIBAN HCL IN NACL 5-0.9 MG/100ML-% IV SOLN
0.0750 ug/kg/min | INTRAVENOUS | Status: AC
  Administered 2020-02-23: 0.075 ug/kg/min via INTRAVENOUS

## 2020-02-23 MED ORDER — HYDROMORPHONE HCL 1 MG/ML IJ SOLN
0.5000 mg | Freq: Once | INTRAMUSCULAR | Status: AC
  Administered 2020-02-23: 0.5 mg via INTRAVENOUS
  Filled 2020-02-23: qty 0.5

## 2020-02-23 MED ORDER — ISOSORBIDE MONONITRATE ER 30 MG PO TB24
30.0000 mg | ORAL_TABLET | Freq: Every day | ORAL | Status: DC
  Administered 2020-02-23 – 2020-02-25 (×3): 30 mg via ORAL
  Filled 2020-02-23 (×3): qty 1

## 2020-02-23 MED ORDER — DULOXETINE HCL 20 MG PO CPEP
20.0000 mg | ORAL_CAPSULE | Freq: Every day | ORAL | Status: DC
  Administered 2020-02-23 – 2020-02-25 (×3): 20 mg via ORAL
  Filled 2020-02-23 (×3): qty 1

## 2020-02-23 MED ORDER — CHLORHEXIDINE GLUCONATE CLOTH 2 % EX PADS
6.0000 | MEDICATED_PAD | Freq: Every day | CUTANEOUS | Status: DC
  Administered 2020-02-23 – 2020-02-24 (×2): 6 via TOPICAL

## 2020-02-23 MED ORDER — TICAGRELOR 90 MG PO TABS
90.0000 mg | ORAL_TABLET | Freq: Two times a day (BID) | ORAL | Status: DC
  Administered 2020-02-23 – 2020-02-25 (×4): 90 mg via ORAL
  Filled 2020-02-23 (×4): qty 1

## 2020-02-23 MED ORDER — TICAGRELOR 90 MG PO TABS
180.0000 mg | ORAL_TABLET | Freq: Once | ORAL | Status: AC
  Administered 2020-02-23: 180 mg via ORAL
  Filled 2020-02-23: qty 2

## 2020-02-23 MED FILL — Nitroglycerin IV Soln 100 MCG/ML in D5W: INTRA_ARTERIAL | Qty: 10 | Status: AC

## 2020-02-23 NOTE — Progress Notes (Signed)
Wife updated on patient condition overnight and the need to pick up the patients pain patch from their pharmacy ([facility]) and to bring it to the hospital to be identified to allow the patient to use it once ordered.

## 2020-02-23 NOTE — Plan of Care (Signed)
Pt remains in 2 Heart after STEMI with ballooning of vessel. Plan for consult for cardiac surgery. Pt remains on argatroban per MD order. Pt with episode of chest pain/indigestion over night.

## 2020-02-23 NOTE — Progress Notes (Signed)
MEDICATION RELATED CONSULT NOTE   Patient on buprenorphine patch prior to admit, pharmacy had discussed that we do not carry the medication and patient would need to supply. When I was following up with patient/family this morning, wife mentioned she obtained medication from outside pharmacy and had already administered the patch to the patient this morning. Nursing aware that patch was placed this morning. Dosed every 7 days will communicate with family if additional doses are needed.   Erin Hearing PharmD., BCPS Clinical Pharmacist 02/23/2020 12:15 PM

## 2020-02-23 NOTE — Progress Notes (Signed)
Rosana Berger paged regarding patients chest pain. EKG does not appear unchanged from previous. Dilaudid 0.5 mg IV given along with 1 chewable tums. Will monitor and if pain not relieved will call back per orders.

## 2020-02-23 NOTE — Progress Notes (Signed)
Patient given one  Nitro sl

## 2020-02-23 NOTE — Progress Notes (Signed)
Patient complained of  Right sided chest pain.  Patient denies shortness of breath or difficulty breathing. Patient states pain feels like pressure. Stat EKG completed.

## 2020-02-23 NOTE — Progress Notes (Signed)
[facility] for aggrastat Indication: chest pain/ACS  Allergies  Allergen Reactions  . Codeine Nausea Only  . Lidoderm [Lidocaine]   . Paxil [Paroxetine Hcl]     Patient Measurements: Height: 5\' 8"  (172.7 cm) Weight: 68.9 kg (151 lb 14.4 oz) IBW/kg (Calculated) : 68.4  Vital Signs: Temp: 98.5 F (36.9 C) (07/13 0751) Temp Source: Oral (07/13 0751) BP: 124/60 (07/13 0800) Pulse Rate: 58 (07/13 0800)  Labs: Recent Labs    02/22/20 1308 02/22/20 1454 02/22/20 1857 02/22/20 2205 02/23/20 0235  HGB 11.6*  11.6*  --   --   --  11.5*  HCT 34.5*  35.1*  --   --   --  34.0*  PLT 250  262  --   --   --  250  CREATININE 0.99  --   --   --  0.79  TROPONINIHS 33*   < > >27,000* >27,000* >27,000*   < > = values in this interval not displayed.    Estimated Creatinine Clearance: 70.1 mL/min (by C-G formula based on SCr of 0.79 mg/dL).   Medical History: Past Medical History:  Diagnosis Date  . AR (allergic rhinitis)   . CAD S/P percutaneous coronary angioplasty    3 stents on '01, one stent in '09, known CTO FCX  . Chronic back pain   . Constipation, unspecified 06/09/2009  . CVA, old, alterations of sensations   . Degeneration of cervical intervertebral disc   . Degeneration of lumbar or lumbosacral intervertebral disc 06/10/2007  . Depression   . Diverticulitis of colon without hemorrhage   . Dyslipidemia   . Epidural abscess   . GERD (gastroesophageal reflux disease)   . Hypertension   . RBBB with left anterior fascicular block   . Spinal stenosis    HAD SURGERY    Assessment: 84 year old male s/p balloon angioplasty yesterday afternoon. Patient was on plavix prior to admit which is now on hold for possible surgery evaluation, aspirin to continue. Thrombotic occlusion noted, orders for brilinta this morning and will stop tirofiban.   Goal of Therapy:  Monitor platelets by anticoagulation protocol: Yes   Plan:  Continue  tirofiban 2 hours after brilinta given  Erin Hearing PharmD., BCPS Clinical Pharmacist 02/23/2020 8:55 AM

## 2020-02-23 NOTE — Progress Notes (Signed)
  Echocardiogram 2D Echocardiogram has been performed.  Jennette Dubin 02/23/2020, 4:01 PM

## 2020-02-23 NOTE — Progress Notes (Signed)
Progress Note  Patient Name: Christopher Ferguson Date of Encounter: 02/23/2020  [facility] Cardiologist: Candee Furbish, MD   Subjective   Mild chest pain this am. No dyspnea.   Inpatient Medications    Scheduled Meds: . aspirin  81 mg Oral Daily  . atenolol  50 mg Oral BID  . Chlorhexidine Gluconate Cloth  6 each Topical Daily  . fluticasone  1 spray Each Nare Daily  . HYDROcodone-acetaminophen  1 tablet Oral TID  . losartan  25 mg Oral Daily  . pantoprazole  40 mg Oral Daily  . polyethylene glycol  17 g Oral Once  . rosuvastatin  40 mg Oral Daily  . sodium chloride flush  3 mL Intravenous Once  . sodium chloride flush  3 mL Intravenous Q12H   Continuous Infusions: . sodium chloride    . tirofiban 0.075 mcg/kg/min (02/23/20 0900)   PRN Meds: sodium chloride, acetaminophen, calcium carbonate, cyclobenzaprine, nitroGLYCERIN, ondansetron (ZOFRAN) IV, sodium chloride flush   Vital Signs    Vitals:   02/23/20 0700 02/23/20 0751 02/23/20 0800 02/23/20 0900  BP: (!) 143/59  124/60 130/85  Pulse: (!) 55  (!) 58 64  Resp: 16  16 20   Temp:  98.5 F (36.9 C)    TempSrc:  Oral    SpO2: 97%  99% 99%  Weight:      Height:        Intake/Output Summary (Last 24 hours) at 02/23/2020 0931 Last data filed at 02/23/2020 0900 Gross per 24 hour  Intake 108.78 ml  Output 350 ml  Net -241.22 ml   Last 3 Weights 02/22/2020 01/18/2020 11/23/2019  Weight (lbs) 151 lb 14.4 oz 152 lb 154 lb  Weight (kg) 68.9 kg 68.947 kg 69.854 kg      Telemetry    sinus- Personally Reviewed  ECG    Sinus, RBBB, LAFB. Inferior Q waves - Personally Reviewed  Physical Exam   GEN: No acute distress.   Neck: No JVD Cardiac: RRR, no murmurs, rubs, or gallops.  Respiratory: Clear to auscultation bilaterally. GI: Soft, nontender, non-distended  MS: No edema; No deformity. Neuro:  Nonfocal  Psych: Normal affect   Labs    High Sensitivity Troponin:   Recent Labs  Lab 02/22/20 1308 02/22/20 1454  02/22/20 1857 02/22/20 2205 02/23/20 0235  TROPONINIHS 33* 39* >27,000* >27,000* >27,000*      Chemistry Recent Labs  Lab 02/22/20 1308 02/23/20 0235  NA 133* 132*  K 3.7 4.2  CL 97* 98  CO2 26 24  GLUCOSE 151* 115*  BUN 16 13  CREATININE 0.99 0.79  CALCIUM 8.9 9.2  GFRNONAA >60 >60  GFRAA >60 >60  ANIONGAP 10 10     Hematology Recent Labs  Lab 02/22/20 1308 02/23/20 0235  WBC 9.5  9.3 11.9*  RBC 3.52*  3.58* 3.52*  HGB 11.6*  11.6* 11.5*  HCT 34.5*  35.1* 34.0*  MCV 98.0  98.0 96.6  MCH 33.0  32.4 32.7  MCHC 33.6  33.0 33.8  RDW 12.4  12.3 12.3  PLT 250  262 250    BNPNo results for input(s): BNP, PROBNP in the last 168 hours.   DDimer No results for input(s): DDIMER in the last 168 hours.   Radiology    DG Chest 2 View  Result Date: 02/22/2020 CLINICAL DATA:  Onset chest pain and shortness of breath this morning. EXAM: CHEST - 2 VIEW COMPARISON:  None. FINDINGS: Asymmetric elevation of the right hemidiaphragm relative to the  left noted. There is minimal left basilar atelectasis. Lungs otherwise clear. Heart size is normal. Aortic atherosclerosis noted. No pneumothorax or pleural fluid. No acute or focal bony abnormality. IMPRESSION: No acute disease. Aortic Atherosclerosis (ICD10-I70.0). Electronically Signed   By: Inge Rise M.D.   On: 02/22/2020 13:38   CARDIAC CATHETERIZATION  Result Date: 02/22/2020  Ost RCA to Prox RCA lesion is 60% stenosed.  Prox RCA to Mid RCA lesion is 100% stenosed.  Mid RCA to Dist RCA lesion is 50% stenosed.  Ost Cx to Prox Cx lesion is 100% stenosed.  Mid LAD-1 lesion is 30% stenosed.  Mid LAD-2 lesion is 100% stenosed.  Mid LAD to Dist LAD lesion is 50% stenosed.  Balloon angioplasty was performed using a BALLOON SAPPHIRE 2.5X12.  Post intervention, there is a 60% residual stenosis.  Post intervention, there is a 50% residual stenosis.  Ramus lesion is 90% stenosed.  1. The LAD courses to the apex. The  most proximal stent in the mid LAD is patent but the vessel is occluded beyond this stent. The distal vessel is seen to fill from left to left bridging collaterals. 2. Large intermediate branch with severe mid stenosis. 3. Chronically occluded proximal Circumflex. The Circumflex fills from left to left collaterals on initial images and from right to left collaterals after the RCA was opened. 4. Thrombotic occlusion of the proximal to mid RCA. The RCA is a large dominant vessel with diffuse calcification of the proximal/mid/distal vessel. The distal RCA fills faintly from left to right collaterals. 5. Successful PTCA with balloon angioplasty only of the RCA. Flow restored. No stent placed. Recommendations: Will continue ASA, statin and beta blocker. I will hold Plavix. Continue Aggrastat drip for now. I will ask CT surgery to see him tomorrow to review his images and decide if he is a candidate for bypass surgery. Given the chronic occlusion of the Circumflex and LAD, full revascularization could not be achieved with PCI/stenting of the RCA. Any further intervention of the RCA would require atherectomy prior to stent placement. Echo in am.    Cardiac Studies   Cardiac cath 02/22/20:  Ost RCA to Prox RCA lesion is 60% stenosed.  Prox RCA to Mid RCA lesion is 100% stenosed.  Mid RCA to Dist RCA lesion is 50% stenosed.  Ost Cx to Prox Cx lesion is 100% stenosed.  Mid LAD-1 lesion is 30% stenosed.  Mid LAD-2 lesion is 100% stenosed.  Mid LAD to Dist LAD lesion is 50% stenosed.  Balloon angioplasty was performed using a BALLOON SAPPHIRE 2.5X12.  Post intervention, there is a 60% residual stenosis.  Post intervention, there is a 50% residual stenosis.  Ramus lesion is 90% stenosed.   1. The LAD courses to the apex. The most proximal stent in the mid LAD is patent but the vessel is occluded beyond this stent. The distal vessel is seen to fill from left to left bridging collaterals.  2. Large  intermediate branch with severe mid stenosis.  3. Chronically occluded proximal Circumflex. The Circumflex fills from left to left collaterals on initial images and from right to left collaterals after the RCA was opened.  4. Thrombotic occlusion of the proximal to mid RCA. The RCA is a large dominant vessel with diffuse calcification of the proximal/mid/distal vessel. The distal RCA fills faintly from left to right collaterals.  5. Successful PTCA with balloon angioplasty only of the RCA. Flow restored. No stent placed.    Patient Profile  84 yo male with history of chronic back pain, CAD s/p coronary stenting in 2001 and 2009, chronically occluded Circumflex, prior CVA, HLD, HTN, RBBB and GERD who presented to the ED today with c/o chest pain and dyspnea for 2-3 hours. The pain radiates from his back to his chest and started around 11 am today. The pain is similar to his prior anginal pain. First EKG in the ED with no clear ST elevation. Repeat EKG with inferior ST elevation. Code STEMI called. RCA was occluded and treated with balloon angioplasty. No stent placed in the RCA due to heavy calcification.   Assessment & Plan    1. CAD/Acute inferior STEMI: Pt admitted 02/22/20 with an acute inferior STEMI. His RCA was occluded proximally. The RCA is a large dominant vessel with heavy calcification and diffuse disease throughout the entire vessel. The previously stented LAD is chronically occluded in the mid segment. The non-dominant Circumflex is chronically occluded.   No great options for PCI beyond the balloon procedure performed yesterday. I have reviewed his cath films and presentation with our Heart team including two CT surgeons and interventional cardiology. His coronary anatomy is not felt to be favorable for CABG as his targets in the LAD and Circumflex are poor. The distal RCA branches are diffusely diseased and poor targets. CABG is not felt to be an option. From an interventional  cardiology standpoint, the RCA is heavily calcified and diffusely diseased. I do not think atherectomy is a good option. The IC team is in agreement with this. I will have our CTO team look at the cath films to give an opinion as well.  Will continue ASA, statin and beta blocker. Will start Brilinta. Will stop Aggrastat drip today. Will add Imdur. Will plan medical management of CAD for now. Monitor in ICU today. Echo today to assess LVEF   Plan discussed at bedside with pt, wife and daughter.    For questions or updates, please contact [facility] Please consult [email or web address] for contact info under        Signed, Lauree Chandler, MD  02/23/2020, 9:31 AM

## 2020-02-24 LAB — CBC
HCT: 31.9 % — ABNORMAL LOW (ref 39.0–52.0)
Hemoglobin: 11 g/dL — ABNORMAL LOW (ref 13.0–17.0)
MCH: 33.7 pg (ref 26.0–34.0)
MCHC: 34.5 g/dL (ref 30.0–36.0)
MCV: 97.9 fL (ref 80.0–100.0)
Platelets: 225 10*3/uL (ref 150–400)
RBC: 3.26 MIL/uL — ABNORMAL LOW (ref 4.22–5.81)
RDW: 12.1 % (ref 11.5–15.5)
WBC: 9.7 10*3/uL (ref 4.0–10.5)
nRBC: 0 % (ref 0.0–0.2)

## 2020-02-24 LAB — BASIC METABOLIC PANEL
Anion gap: 9 (ref 5–15)
BUN: 14 mg/dL (ref 8–23)
CO2: 25 mmol/L (ref 22–32)
Calcium: 9.1 mg/dL (ref 8.9–10.3)
Chloride: 96 mmol/L — ABNORMAL LOW (ref 98–111)
Creatinine, Ser: 0.71 mg/dL (ref 0.61–1.24)
GFR calc Af Amer: >60 mL/min (ref >60)
GFR calc non Af Amer: >60 mL/min (ref >60)
Glucose, Bld: 118 mg/dL — ABNORMAL HIGH (ref 70–99)
Potassium: 4.8 mmol/L (ref 3.5–5.1)
Sodium: 130 mmol/L — ABNORMAL LOW (ref 135–145)

## 2020-02-24 MED ORDER — ATENOLOL 25 MG PO TABS
50.0000 mg | ORAL_TABLET | Freq: Every day | ORAL | Status: DC
  Filled 2020-02-24: qty 2

## 2020-02-24 MED ORDER — ATENOLOL 25 MG PO TABS: 50.0000 mg | ORAL_TABLET | Freq: Every day | ORAL | Status: DC

## 2020-02-24 NOTE — Progress Notes (Signed)
   02/24/20 1000  Clinical Encounter Type  Visited With Patient and family together  Visit Type Initial  Spiritual Encounters  Spiritual Needs Prayer   Chaplain engaged in initial visit with Christopher Ferguson and Christopher Ferguson.  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  Christopher Ferguson also shared that he has had significant health challenges that could have killed him but that he credits God for allowing him to be a miracle.  Both Christopher Ferguson and his wife expressed a great faith in God.  Chaplain listened to Christopher Ferguson's testimony as he recounted the different things he has been through and accomplished.  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  Chaplain offered the ministries of presence, listening and prayer.

## 2020-02-24 NOTE — Progress Notes (Signed)
Progress Note  Patient Name: Christopher Ferguson Date of Encounter: 02/24/2020  [facility] Cardiologist: Candee Furbish, MD   Subjective   He feels great today. No chest pain or dyspnea.   Inpatient Medications    Scheduled Meds: . aspirin  81 mg Oral Daily  . atenolol  50 mg Oral BID  . Chlorhexidine Gluconate Cloth  6 each Topical Daily  . DULoxetine  20 mg Oral Daily  . fluticasone  1 spray Each Nare Daily  . HYDROcodone-acetaminophen  1 tablet Oral TID  . isosorbide mononitrate  30 mg Oral Daily  . losartan  25 mg Oral Daily  . pantoprazole  40 mg Oral Daily  . polyethylene glycol  17 g Oral Once  . rosuvastatin  40 mg Oral Daily  . sodium chloride flush  3 mL Intravenous Once  . sodium chloride flush  3 mL Intravenous Q12H  . ticagrelor  90 mg Oral BID   Continuous Infusions: . sodium chloride     PRN Meds: sodium chloride, acetaminophen, calcium carbonate, cyclobenzaprine, nitroGLYCERIN, ondansetron (ZOFRAN) IV, sodium chloride flush   Vital Signs    Vitals:   02/24/20 0500 02/24/20 0600 02/24/20 0700 02/24/20 0737  BP:  (!) 132/56 (!) 146/58   Pulse: (!) 50 (!) 50 (!) 51   Resp: 17 12    Temp:    (!) 97.4 F (36.3 C)  TempSrc:      SpO2: 96% 97% 98%   Weight:  66.3 kg    Height:        Intake/Output Summary (Last 24 hours) at 02/24/2020 0802 Last data filed at 02/24/2020 0400 Gross per 24 hour  Intake 627.2 ml  Output 600 ml  Net 27.2 ml   Last 3 Weights 02/24/2020 02/22/2020 01/18/2020  Weight (lbs) 146 lb 1.6 oz 151 lb 14.4 oz 152 lb  Weight (kg) 66.271 kg 68.9 kg 68.947 kg      Telemetry    Sinus brady with PVCs- Personally Reviewed  ECG    No AM EKG- Personally Reviewed  Physical Exam    General: Well developed, well nourished, NAD  HEENT: OP clear, mucus membranes moist  SKIN: warm, dry. No rashes. Neuro: No focal deficits  Musculoskeletal: Muscle strength 5/5 all ext  Psychiatric: Mood and affect normal  Neck: No JVD Lungs:Clear  bilaterally, no wheezes, rhonci, crackles Cardiovascular: Regular rate and rhythm. No murmurs, gallops or rubs. Abdomen:Soft. Bowel sounds present. Non-tender.  Extremities: No lower extremity edema. Pulses are 2 + in the bilateral DP/PT.    Labs    High Sensitivity Troponin:   Recent Labs  Lab 02/22/20 1308 02/22/20 1454 02/22/20 1857 02/22/20 2205 02/23/20 0235  TROPONINIHS 33* 39* >27,000* >27,000* >27,000*      Chemistry Recent Labs  Lab 02/22/20 1308 02/23/20 0235 02/24/20 0046  NA 133* 132* 130*  K 3.7 4.2 4.8  CL 97* 98 96*  CO2 26 24 25   GLUCOSE 151* 115* 118*  BUN 16 13 14   CREATININE 0.99 0.79 0.71  CALCIUM 8.9 9.2 9.1  GFRNONAA >60 >60 >60  GFRAA >60 >60 >60  ANIONGAP 10 10 9      Hematology Recent Labs  Lab 02/22/20 1308 02/23/20 0235 02/24/20 0046  WBC 9.5  9.3 11.9* 9.7  RBC 3.52*  3.58* 3.52* 3.26*  HGB 11.6*  11.6* 11.5* 11.0*  HCT 34.5*  35.1* 34.0* 31.9*  MCV 98.0  98.0 96.6 97.9  MCH 33.0  32.4 32.7 33.7  MCHC 33.6  33.0  33.8 34.5  RDW 12.4  12.3 12.3 12.1  PLT 250  262 250 225    BNPNo results for input(s): BNP, PROBNP in the last 168 hours.   DDimer No results for input(s): DDIMER in the last 168 hours.   Radiology    DG Chest 2 View  Result Date: 02/22/2020 CLINICAL DATA:  Onset chest pain and shortness of breath this morning. EXAM: CHEST - 2 VIEW COMPARISON:  None. FINDINGS: Asymmetric elevation of the right hemidiaphragm relative to the left noted. There is minimal left basilar atelectasis. Lungs otherwise clear. Heart size is normal. Aortic atherosclerosis noted. No pneumothorax or pleural fluid. No acute or focal bony abnormality. IMPRESSION: No acute disease. Aortic Atherosclerosis (ICD10-I70.0). Electronically Signed   By: Inge Rise M.D.   On: 02/22/2020 13:38   CARDIAC CATHETERIZATION  Addendum Date: 02/23/2020    Ost RCA to Prox RCA lesion is 60% stenosed.  Prox RCA to Mid RCA lesion is 100% stenosed.   Mid RCA to Dist RCA lesion is 50% stenosed.  Ost Cx to Prox Cx lesion is 100% stenosed.  Mid LAD-1 lesion is 30% stenosed.  Mid LAD-2 lesion is 100% stenosed.  Mid LAD to Dist LAD lesion is 50% stenosed.  Balloon angioplasty was performed using a BALLOON SAPPHIRE 2.5X12.  Post intervention, there is a 60% residual stenosis.  Post intervention, there is a 50% residual stenosis.  Ramus lesion is 90% stenosed.  1. The LAD courses to the apex. The most proximal stent in the mid LAD is patent but the vessel is occluded beyond this stent. The distal vessel is seen to fill from left to left bridging collaterals. 2. Large intermediate branch with severe mid stenosis. 3. Chronically occluded proximal Circumflex. The Circumflex fills from left to left collaterals on initial images and from right to left collaterals after the RCA was opened. 4. Thrombotic occlusion of the proximal to mid RCA. The RCA is a large dominant vessel with diffuse calcification of the proximal/mid/distal vessel. The distal RCA fills faintly from left to right collaterals. 5. Successful PTCA with balloon angioplasty only of the RCA. Flow restored. No stent placed. Recommendations: Will continue ASA, statin and beta blocker. I will hold Plavix. Continue Aggrastat drip for now. I will ask CT surgery to see him tomorrow to review his images and decide if he is a candidate for bypass surgery. Given the chronic occlusion of the Circumflex and LAD, full revascularization could not be achieved with PCI/stenting of the RCA. Any further intervention of the RCA would require atherectomy prior to stent placement. Echo in am.   Result Date: 02/22/2020  Ost RCA to Prox RCA lesion is 60% stenosed.  Prox RCA to Mid RCA lesion is 100% stenosed.  Mid RCA to Dist RCA lesion is 50% stenosed.  Ost Cx to Prox Cx lesion is 100% stenosed.  Mid LAD-1 lesion is 30% stenosed.  Mid LAD-2 lesion is 100% stenosed.  Mid LAD to Dist LAD lesion is 50% stenosed.   Balloon angioplasty was performed using a BALLOON SAPPHIRE 2.5X12.  Post intervention, there is a 60% residual stenosis.  Post intervention, there is a 50% residual stenosis.  Ramus lesion is 90% stenosed.  1. The LAD courses to the apex. The most proximal stent in the mid LAD is patent but the vessel is occluded beyond this stent. The distal vessel is seen to fill from left to left bridging collaterals. 2. Large intermediate branch with severe mid stenosis. 3. Chronically occluded proximal Circumflex.  The Circumflex fills from left to left collaterals on initial images and from right to left collaterals after the RCA was opened. 4. Thrombotic occlusion of the proximal to mid RCA. The RCA is a large dominant vessel with diffuse calcification of the proximal/mid/distal vessel. The distal RCA fills faintly from left to right collaterals. 5. Successful PTCA with balloon angioplasty only of the RCA. Flow restored. No stent placed. Recommendations: Will continue ASA, statin and beta blocker. I will hold Plavix. Continue Aggrastat drip for now. I will ask CT surgery to see him tomorrow to review his images and decide if he is a candidate for bypass surgery. Given the chronic occlusion of the Circumflex and LAD, full revascularization could not be achieved with PCI/stenting of the RCA. Any further intervention of the RCA would require atherectomy prior to stent placement. Echo in am.   ECHOCARDIOGRAM COMPLETE  Result Date: 02/23/2020    ECHOCARDIOGRAM REPORT   Patient Name:   LUIS NICKLES Date of Exam: 02/23/2020 Medical Rec #:  [id number]    Height:       68.0 in Accession #:    [id number]   Weight:       151.9 lb Date of Birth:  03/30/1936   BSA:          1.818 m Patient Age:    32 years     BP:           99/45 mmHg Patient Gender: M            HR:           49 bpm. Exam Location:  Inpatient Procedure: 2D Echo Indications:    CAD Native Vessel I25.10  History:        Patient has no prior history of Echocardiogram  examinations.                 CAD; Risk Factors:Hypertension.  Sonographer:    Mikki Santee RDCS (AE) Referring Phys: [place]  1. Left ventricular ejection fraction, by estimation, is 55 to 60%. The left ventricle has normal function. The left ventricle has no regional wall motion abnormalities. There is mild concentric left ventricular hypertrophy. Left ventricular diastolic parameters are indeterminate.  2. Right ventricular systolic function is normal. The right ventricular size is normal.  3. The mitral valve is normal in structure. Trivial mitral valve regurgitation.  4. The aortic valve is tricuspid. Aortic valve regurgitation is mild.  5. Aortic dilatation noted. There is moderate dilatation of the ascending aorta measuring 44 mm.  6. The inferior vena cava is normal in size with greater than 50% respiratory variability, suggesting right atrial pressure of 3 mmHg. Comparison(s): No prior Echocardiogram. Conclusion(s)/Recommendation(s): Normal biventricular function without evidence of hemodynamically significant valvular heart disease. FINDINGS  Left Ventricle: Left ventricular ejection fraction, by estimation, is 55 to 60%. The left ventricle has normal function. The left ventricle has no regional wall motion abnormalities. The left ventricular internal cavity size was normal in size. There is  mild concentric left ventricular hypertrophy. Left ventricular diastolic parameters are indeterminate. Right Ventricle: The right ventricular size is normal. No increase in right ventricular wall thickness. Right ventricular systolic function is normal. Left Atrium: Left atrial size was normal in size. Right Atrium: Right atrial size was normal in size. Pericardium: There is no evidence of pericardial effusion. Presence of pericardial fat pad. Mitral Valve: The mitral valve is normal in structure. Trivial mitral valve regurgitation. Tricuspid Valve: The tricuspid valve  is normal in  structure. Tricuspid valve regurgitation is trivial. Aortic Valve: The aortic valve is tricuspid. Aortic valve regurgitation is mild. Pulmonic Valve: The pulmonic valve was not well visualized. Pulmonic valve regurgitation is not visualized. Aorta: Aortic dilatation noted. There is moderate dilatation of the ascending aorta measuring 44 mm. Venous: The inferior vena cava is normal in size with greater than 50% respiratory variability, suggesting right atrial pressure of 3 mmHg. IAS/Shunts: The atrial septum is grossly normal.  LEFT VENTRICLE PLAX 2D LVIDd:         5.50 cm  Diastology LVIDs:         3.90 cm  LV e' lateral:   5.85 cm/s LV PW:         1.20 cm  LV E/e' lateral: 9.0 LV IVS:        1.50 cm  LV e' medial:    3.45 cm/s LVOT diam:     2.30 cm  LV E/e' medial:  15.3 LV SV:         76 LV SV Index:   42 LVOT Area:     4.15 cm  RIGHT VENTRICLE RV S prime:     14.90 cm/s TAPSE (M-mode): 2.3 cm LEFT ATRIUM             Index       RIGHT ATRIUM           Index LA diam:        3.00 cm 1.65 cm/m  RA Area:     14.80 cm LA Vol (A2C):   45.5 ml 25.02 ml/m RA Volume:   34.50 ml  18.97 ml/m LA Vol (A4C):   25.2 ml 13.86 ml/m LA Biplane Vol: 34.5 ml 18.97 ml/m  AORTIC VALVE LVOT Vmax:   80.30 cm/s LVOT Vmean:  50.400 cm/s LVOT VTI:    0.182 m  AORTA Ao Root diam: 3.50 cm Ao Asc diam:  4.40 cm MITRAL VALVE MV Area (PHT): 1.75 cm    SHUNTS MV Decel Time: 433 msec    Systemic VTI:  0.18 m MV E velocity: 52.70 cm/s  Systemic Diam: 2.30 cm MV A velocity: 57.40 cm/s MV E/A ratio:  0.92 Buford Dresser MD Electronically signed by Buford Dresser MD Signature Date/Time: 02/23/2020/7:41:28 PM    Final     Cardiac Studies   Cardiac cath 02/22/20:  Colon Flattery RCA to Prox RCA lesion is 60% stenosed.  Prox RCA to Mid RCA lesion is 100% stenosed.  Mid RCA to Dist RCA lesion is 50% stenosed.  Ost Cx to Prox Cx lesion is 100% stenosed.  Mid LAD-1 lesion is 30% stenosed.  Mid LAD-2 lesion is 100% stenosed.  Mid  LAD to Dist LAD lesion is 50% stenosed.  Balloon angioplasty was performed using a BALLOON SAPPHIRE 2.5X12.  Post intervention, there is a 60% residual stenosis.  Post intervention, there is a 50% residual stenosis.  Ramus lesion is 90% stenosed.   1. The LAD courses to the apex. The most proximal stent in the mid LAD is patent but the vessel is occluded beyond this stent. The distal vessel is seen to fill from left to left bridging collaterals.  2. Large intermediate branch with severe mid stenosis.  3. Chronically occluded proximal Circumflex. The Circumflex fills from left to left collaterals on initial images and from right to left collaterals after the RCA was opened.  4. Thrombotic occlusion of the proximal to mid RCA. The RCA is a large dominant vessel with  diffuse calcification of the proximal/mid/distal vessel. The distal RCA fills faintly from left to right collaterals.  5. Successful PTCA with balloon angioplasty only of the RCA. Flow restored. No stent placed.   Echo 02/24/20:  1. Left ventricular ejection fraction, by estimation, is 55 to 60%. The  left ventricle has normal function. The left ventricle has no regional  wall motion abnormalities. There is mild concentric left ventricular  hypertrophy. Left ventricular diastolic  parameters are indeterminate.  2. Right ventricular systolic function is normal. The right ventricular  size is normal.  3. The mitral valve is normal in structure. Trivial mitral valve  regurgitation.  4. The aortic valve is tricuspid. Aortic valve regurgitation is mild.  5. Aortic dilatation noted. There is moderate dilatation of the ascending  aorta measuring 44 mm.  6. The inferior vena cava is normal in size with greater than 50%  respiratory variability, suggesting right atrial pressure of 3 mmHg.    Patient Profile     84 yo male with history of chronic back pain, CAD s/p coronary stenting in 2001 and 2009, chronically occluded  Circumflex, prior CVA, HLD, HTN, RBBB and GERD who presented to the ED today with c/o chest pain and dyspnea for 2-3 hours. The pain radiates from his back to his chest and started around 11 am today. The pain is similar to his prior anginal pain. First EKG in the ED with no clear ST elevation. Repeat EKG with inferior ST elevation. Code STEMI called. RCA was occluded and treated with balloon angioplasty. No stent placed in the RCA due to heavy calcification.   Assessment & Plan    1. CAD/Acute inferior STEMI: Pt admitted 02/22/20 with an acute inferior STEMI. His RCA was occluded proximally. The RCA is a large dominant vessel with heavy calcification and diffuse disease throughout the entire vessel. The previously stented LAD is chronically occluded in the mid segment. The non-dominant Circumflex is chronically occluded. I have reviewed his cath films and presentation with our Heart team including two CT surgeons and interventional cardiology. His coronary anatomy is not felt to be favorable for CABG as his targets in the LAD and Circumflex are poor. The distal RCA branches are diffusely diseased and poor targets. CABG is not felt to be an option.   From an interventional cardiology standpoint, the RCA is heavily calcified and diffusely diseased. We cannot perform atherectomy of the RCA yet given the recent balloon angioplasty. I have reviewed the case with our CTO team and it is a consideration to bring him back in a month for a complex CTO PCI of the LAD and relook at the RCA at that time.   Will continue medical management of his CAD for now. Echo with normal LV function. Continue ASA, Brilinta, statin and beta blocker. Reduce atenolol to 50 mg once a day with bradycardia   Transfer to telemetry today     For questions or updates, please contact [place] Please consult [email or web address] for contact info under        Signed, Lauree Chandler, MD  02/24/2020, 8:02 AM

## 2020-02-24 NOTE — Progress Notes (Signed)
CARDIAC REHAB PHASE I   PRE:  Rate/Rhythm: 59 SB  BP:  Sitting: 122/57      SaO2: 99 RA  MODE:  Ambulation: 370 ft   POST:  Rate/Rhythm: 66 SR  BP:  Sitting: 168/64    SaO2: 100 RA   Pt ambulated 324ft in hallway standby assist with front wheel walker. Pt denies CP, SOB, or dizziness. Pt and wife educated on importance of ASA, Brilinta, and NTG. Pt given MI book along with heart healthy diet. Reviewed site care, restrictions, and exercise guidelines. Will refer to CRP II [place] with knowledge pt for staged intervention. Will continue to follow.  [id number] Rufina Falco, RN BSN 02/24/2020 10:10 AM

## 2020-02-25 DIAGNOSIS — I77810 Thoracic aortic ectasia: Secondary | ICD-10-CM

## 2020-02-25 LAB — BASIC METABOLIC PANEL
Anion gap: 11 (ref 5–15)
BUN: 20 mg/dL (ref 8–23)
CO2: 23 mmol/L (ref 22–32)
Calcium: 9.1 mg/dL (ref 8.9–10.3)
Chloride: 95 mmol/L — ABNORMAL LOW (ref 98–111)
Creatinine, Ser: 0.78 mg/dL (ref 0.61–1.24)
GFR calc Af Amer: >60 mL/min (ref >60)
GFR calc non Af Amer: >60 mL/min (ref >60)
Glucose, Bld: 105 mg/dL — ABNORMAL HIGH (ref 70–99)
Potassium: 4.2 mmol/L (ref 3.5–5.1)
Sodium: 129 mmol/L — ABNORMAL LOW (ref 135–145)

## 2020-02-25 LAB — LIPID PANEL
Cholesterol: 133 mg/dL (ref 0–200)
HDL: 51 mg/dL (ref >40)
LDL Cholesterol: 67 mg/dL (ref 0–99)
Total CHOL/HDL Ratio: 2.6 RATIO
Triglycerides: 75 mg/dL (ref <150)
VLDL: 15 mg/dL (ref 0–40)

## 2020-02-25 LAB — HEMOGLOBIN A1C
Hgb A1c MFr Bld: 5.2 % (ref 4.8–5.6)
Mean Plasma Glucose: 102.54 mg/dL

## 2020-02-25 MED ORDER — ATENOLOL 50 MG PO TABS: 50.0000 mg | ORAL_TABLET | Freq: Every day | ORAL | 2 refills | Status: AC

## 2020-02-25 MED ORDER — ISOSORBIDE MONONITRATE ER 30 MG PO TB24: 30.0000 mg | ORAL_TABLET | Freq: Every day | ORAL | 2 refills | Status: AC

## 2020-02-25 MED ORDER — POLYETHYLENE GLYCOL 3350 17 G PO PACK
17.0000 g | PACK | Freq: Once | ORAL | Status: AC
  Administered 2020-02-25: 17 g via ORAL
  Filled 2020-02-25: qty 1

## 2020-02-25 MED ORDER — TICAGRELOR 90 MG PO TABS: 90.0000 mg | ORAL_TABLET | Freq: Two times a day (BID) | ORAL | 11 refills | Status: AC

## 2020-02-25 MED ORDER — ASPIRIN 81 MG PO CHEW: 81.0000 mg | CHEWABLE_TABLET | Freq: Every day | ORAL | Status: AC

## 2020-02-25 MED FILL — ISOSORBIDE MN ER 30 MG TAB: 30 | 30 days supply | Qty: 30 | Fill #0

## 2020-02-25 MED FILL — BRILINTA 90 MG TABLET: 90 | 30 days supply | Qty: 60 | Fill #0

## 2020-02-25 MED FILL — ATENOLOL 50 MG TAB: 50 | 30 days supply | Qty: 30 | Fill #0

## 2020-02-25 NOTE — TOC Initial Note (Signed)
Transition of Care [facility]) - Initial/Assessment Note    Patient Details  Name: Christopher Ferguson MRN: [id number] Date of Birth: [birth year]  Transition of Care [facility]) CM/SW Contact:    Curlene Labrum, RN Phone Number: 02/25/2020, 11:08 AM  Clinical Narrative:                 Case management met with the patient a wife at the bedside regarding transition to home today.  The wife would like a few nursing visits at home for teaching for new medications and disease management.  Patient was given choice regarding home health and patient did not have a preference.  [facility] was called and they will provide RN for teaching and support.  Family was update.  Patient will discharge today.  Expected Discharge Plan: [place] Barriers to Discharge: No Barriers Identified   Patient Goals and CMS Choice Patient states their goals for this hospitalization and ongoing recovery are:: Patient's wife request [facility] RN for a couple visits for teachind and support for discharge. CMS Medicare.gov Compare Post Acute Care list provided to:: Patient Choice offered to / list presented to : Patient  Expected Discharge Plan and Services Expected Discharge Plan: [place]   Discharge Planning Services: CM Consult Post Acute Care Choice: [place] arrangements for the past 2 months: [place]: RN [facility] Agency: [place] Date [place]: 02/25/20 Time HH Agency Contacted: 1107 Representative spoke with at [place]: Tommi Rumps with [facility]  Prior Living Arrangements/Services Living arrangements for the past 2 months: [place] Lives with:: Spouse Patient language and need for interpreter reviewed:: Yes Do you feel safe going back to the place where you live?: Yes      Need for Family Participation in Patient Care: Yes (Comment) Care giver support system in place?: Yes  (comment) Current home services: DME (Patient has rolling walker at home.) Criminal Activity/Legal Involvement Pertinent to Current Situation/Hospitalization: No - Comment as needed  Activities of Daily Living Home Assistive Devices/Equipment: None ADL Screening (condition at time of admission) Patient's cognitive ability adequate to safely complete daily activities?: Yes Is the patient deaf or have difficulty hearing?: No Does the patient have difficulty seeing, even when wearing glasses/contacts?: No Does the patient have difficulty concentrating, remembering, or making decisions?: No Patient able to express need for assistance with ADLs?: Yes Does the patient have difficulty dressing or bathing?: No Independently performs ADLs?: Yes (appropriate for developmental age) Does the patient have difficulty walking or climbing stairs?: No Weakness of Legs: None Weakness of Arms/Hands: None  Permission Sought/Granted Permission sought to share information with : Case Manager Permission granted to share information with : Yes, Verbal Permission Granted     Permission granted to share info w AGENCY: Home health agency  Permission granted to share info w Relationship: Patient's wife     Emotional Assessment Appearance:: Appears stated age Attitude/Demeanor/Rapport: Gracious Affect (typically observed): Accepting Orientation: : Oriented to Self, Oriented to Place, Oriented to  Time, Oriented to Situation Alcohol / Substance Use: Not Applicable Psych Involvement: No (comment)  Admission diagnosis:  ST elevation myocardial infarction involving right coronary artery (HCC) [I21.11] Acute ST elevation myocardial infarction (STEMI) of inferior wall (HCC) [I21.19] Patient Active Problem List   Diagnosis Date Noted  . Acute  ST elevation myocardial infarction (STEMI) of inferior wall ([place]) 02/22/2020  . ST elevation myocardial infarction involving right coronary artery ([place])   . Pre-operative  cardiovascular examination 08/11/2018  . CAD S/P percutaneous coronary angioplasty   . Dyslipidemia   . Hypertension   . Spinal stenosis   . RBBB with left anterior fascicular block   . GERD (gastroesophageal reflux disease)    PCP:  Kristen Loader, FNP Pharmacy:   [place], [place] [address] [place] [address] Phone: [phone] Fax: [phone]  [id number]: [phone]: [phone] [place] [address]Phone: [phone]: [phone]    Social Determinants of Health (SDOH) Interventions    Readmission Risk Interventions Readmission Risk Prevention Plan 02/25/2020  Post Dischage Appt Complete  Medication Screening Complete  Transportation Screening Complete  Some recent data might be hidden

## 2020-02-25 NOTE — Progress Notes (Signed)
CARDIAC REHAB PHASE I   PRE:  Rate/Rhythm: 23 SR  BP:  Sitting: 156/71      SaO2: 98 RA  MODE:  Ambulation: 370 ft   POST:  Rate/Rhythm: 76 SR  BP:  Sitting: 143/67    SaO2: 97 RA  Pt assisted to BR than ambulated 341ft in hallway standby assist with front wheel walker. Pt denies CP or SOB. Pt returned to recliner. Reinforced importance of ASA and Brilinta with pt. Pt and wife with many concerns about needing more work done and the potential of having another heart attack. Pt aware of signs and symptoms to watch for, and when to call the ambulance. MD came in and spoke with pt during session, pt and wife feeling better with plan. Pt referred to CRP II [place] with knowledge pt needs staged intervention.   [id number] Rufina Falco, RN BSN 02/25/2020 11:34 AM

## 2020-02-25 NOTE — TOC Benefit Eligibility Note (Signed)
Transition of Care [facility]) Benefit Eligibility Note    Patient Details  Name: Christopher Ferguson MRN: [id number] Date of Birth: [birth year]   Medication/Dose: Brilinta 90 mg. bid for 30 day supply  Covered?: Yes  Tier: 2 Drug  Prescription Coverage [facility]  Spoke with Person/Company/Phone Number:: Rulon Sera PH# [phone]  Co-Pay: $35.00  Prior Approval: No  Deductible: Met       Shelda Altes Phone Number: 02/25/2020, 2:07 PM

## 2020-02-25 NOTE — Discharge Summary (Addendum)
Discharge Summary    Patient ID: Christopher Ferguson MRN: [id number]; DOB: 05/16/1936  Admit date: 02/22/2020 Discharge date: 02/25/2020  Primary Care Provider: Kristen Loader, FNP  Primary Cardiologist: Candee Furbish, MD  Primary Electrophysiologist:  None   Discharge Diagnoses    Principal Problem:   ST elevation myocardial infarction involving right coronary artery [facility]) Active Problems:   CAD S/P percutaneous coronary angioplasty   Dyslipidemia   Hypertension   Acute ST elevation myocardial infarction (STEMI) of inferior wall [facility])   Ascending aorta dilatation [facility])    Diagnostic Studies/Procedures    Left Heart Catheterization 02/22/2020:  Ost RCA to Prox RCA lesion is 60% stenosed.  Prox RCA to Mid RCA lesion is 100% stenosed.  Mid RCA to Dist RCA lesion is 50% stenosed.  Ost Cx to Prox Cx lesion is 100% stenosed.  Mid LAD-1 lesion is 30% stenosed.  Mid LAD-2 lesion is 100% stenosed.  Mid LAD to Dist LAD lesion is 50% stenosed.  Balloon angioplasty was performed using a BALLOON SAPPHIRE 2.5X12.  Post intervention, there is a 60% residual stenosis.  Post intervention, there is a 50% residual stenosis.  Ramus lesion is 90% stenosed.   1. The LAD courses to the apex. The most proximal stent in the mid LAD is patent but the vessel is occluded beyond this stent. The distal vessel is seen to fill from left to left bridging collaterals.  2. Large intermediate branch with severe mid stenosis.  3. Chronically occluded proximal Circumflex. The Circumflex fills from left to left collaterals on initial images and from right to left collaterals after the RCA was opened.  4. Thrombotic occlusion of the proximal to mid RCA. The RCA is a large dominant vessel with diffuse calcification of the proximal/mid/distal vessel. The distal RCA fills faintly from left to right collaterals.  5. Successful PTCA with balloon angioplasty only of the RCA. Flow restored. No stent placed.    Recommendations: Will continue ASA, statin and beta blocker. I will hold Plavix. Continue Aggrastat drip for now. I will ask CT surgery to see him tomorrow to review his images and decide if he is a candidate for bypass surgery. Given the chronic occlusion of the Circumflex and LAD, full revascularization could not be achieved with PCI/stenting of the RCA. Any further intervention of the RCA would require atherectomy prior to stent placement. Echo in am.   Diagnostic Dominance: Right  Intervention     _____________   Echocardiogram 02/23/2020: Impressions: 1. Left ventricular ejection fraction, by estimation, is 55 to 60%. The  left ventricle has normal function. The left ventricle has no regional  wall motion abnormalities. There is mild concentric left ventricular  hypertrophy. Left ventricular diastolic  parameters are indeterminate.  2. Right ventricular systolic function is normal. The right ventricular  size is normal.  3. The mitral valve is normal in structure. Trivial mitral valve  regurgitation.  4. The aortic valve is tricuspid. Aortic valve regurgitation is mild.  5. Aortic dilatation noted. There is moderate dilatation of the ascending  aorta measuring 44 mm.  6. The inferior vena cava is normal in size with greater than 50%  respiratory variability, suggesting right atrial pressure of 3 mmHg.   Comparison(s): No prior Echocardiogram.   Conclusion(s)/Recommendation(s): Normal biventricular function without  evidence of hemodynamically significant valvular heart disease.   History of Present Illness     Christopher Ferguson is a 84 y.o. male with  a history of CAD with prior stenting in 2001 and  2009 and known CTO of circumflex, RBBB, prior CVA, hypertension, hyperlipidemia, and GERD who to the ED on 02/22/2020 for further evaluation of chest pain and dyspnea for 2-3 hours. Pain radiated from his back to his chest and started around 11am on day of presentation. Pain  similar to prior anginal pain. First EKG in the ED without clear ST elevation. However, repeat EKG showed inferior ST elevation. Code STEMI called. Patient started on IV Heparin in the ED. Aspirin had already been given by EMS.   Patient taken to cath lab for emergent cardiac catheterization.    Hospital Course     Consultants: None   STEMI High-sensitivity troponin peaked at >27,000. Patient underwent emergent cardiac catheterization which showed severe multivessel with 60% of the ostial to proximal RCA followed by 100% thrombotic occlusion of proximal to mid RCA and 50% stenosis of the mid to distal RCA as well as 100% stenosis of the ostial to proximal CX, and 100% stenosis stenosis of mid LAD. The most proximal stent in the mid LAD is patent the vessel occluded beyond this stent. Distal vessel does have left to left bridging collaterals. CX also fills from collaterals. RCA treated with balloon angioplasty and flow was restored. No stent was placed. Echo showed LVEF of 55-60% with normal wall motion, mild AI, and moderate dilatation of ascending aorta measuring 55mm. Cath films reviewed with CT surgery; however, not felt to be favorable for CABG due to poor targets. From an intervention Cardiology standpoint, RCA is heavily calcified and diffusely diseased. Atherectomy of RCA cannot be performed yet given recent balloon angioplasty. Dr. Angelena Form reviewed case with our CTO team and patient will come back for CTO PCI of the LAD and relook at the RCA at that time. Will continue medical management at this time. Home Plavix stopped and started on Brilinta 90mg  twice daily in addition to Aspirin 81mg  daily.  Atenolol reduced to 50mg  daily on 02/24/2020 due to bradycardia - will continue this dose at discharge. Continue Crestor 40mg  daily. Able to ambulate with Cardiac Rehab prior to discharge.  Of note, Dr. Martinique went and saw patient prior to discharge and spoke with him about CTO PCI of the LAD. Benefits  and risks reviewed with patient and patient agreed to return on 03/24/2020 for this procedure. Procedure already scheduled. Will arrange office visit in 1-2 weeks for follow-up and pre-procedural labs can be checked at that time. Will need pre-cath orders placed at that time as well.  Dilated Ascending Aorta Echo showed moderate dilatation of the ascending aorta measuring 60mm. BP mostly well controlled. Consider outpatient CTA for further evaluation.  Hypertension BP has been mostly well controlled. Continue Losartan 25mg  daily, Atenolol 50mg  daily, and Imdur 30mg  daily.  Hyperlipidemia  Lipid panel this admission: Total Cholesterol 133, Triglycerides 75, HDL 51, LDL 67. LDL goal <70 given CAD. Continue Crestor 40mg  daily.  Hyponatremia Stable. Sodium 129 on day of discharge. Will need repeat BMET at follow-up visit.  Patient seen and examined by Dr. Angelena Form today and determined to be stable for discharge. Outpatient follow-up arranged. Medications as below.    Did the patient have an acute coronary syndrome (MI, NSTEMI, STEMI, etc) this admission?:  Yes                               AHA/ACC Clinical Performance & Quality Measures: 1. Aspirin prescribed? - Yes 2. ADP Receptor Inhibitor (Plavix/Clopidogrel, Brilinta/Ticagrelor or Effient/Prasugrel) prescribed (  includes medically managed patients)? - Yes 3. Beta Blocker prescribed? - Yes 4. High Intensity Statin (Lipitor 40-80mg  or Crestor 20-40mg ) prescribed? - Yes 5. EF assessed during THIS hospitalization? - Yes 6. For EF <40%, was ACEI/ARB prescribed? - Not Applicable (EF >/= 85%) 7. For EF <40%, Aldosterone Antagonist (Spironolactone or Eplerenone) prescribed? - Not Applicable (EF >/= 27%) 8. Cardiac Rehab Phase II ordered (including medically managed patients)? - Yes   _____________  Discharge Vitals Blood pressure 117/68, pulse 68, temperature 98.1 F (36.7 C), temperature source Axillary, resp. rate 18, height 5\' 8"  (1.727 m),  weight 66.3 kg, SpO2 96 %.  Filed Weights   02/22/20 1301 02/24/20 0600  Weight: 68.9 kg 66.3 kg    Labs & Radiologic Studies    CBC Recent Labs    02/23/20 0235 02/24/20 0046  WBC 11.9* 9.7  HGB 11.5* 11.0*  HCT 34.0* 31.9*  MCV 96.6 97.9  PLT 250 782   Basic Metabolic Panel Recent Labs    02/24/20 0046 02/25/20 0232  NA 130* 129*  K 4.8 4.2  CL 96* 95*  CO2 25 23  GLUCOSE 118* 105*  BUN 14 20  CREATININE 0.71 0.78  CALCIUM 9.1 9.1   Liver Function Tests No results for input(s): AST, ALT, ALKPHOS, BILITOT, PROT, ALBUMIN in the last 72 hours. No results for input(s): LIPASE, AMYLASE in the last 72 hours. High Sensitivity Troponin:   Recent Labs  Lab 02/22/20 1308 02/22/20 1454 02/22/20 1857 02/22/20 2205 02/23/20 0235  TROPONINIHS 33* 39* >27,000* >27,000* >27,000*    BNP Invalid input(s): POCBNP D-Dimer No results for input(s): DDIMER in the last 72 hours. Hemoglobin A1C Recent Labs    02/25/20 0232  HGBA1C 5.2   Fasting Lipid Panel Recent Labs    02/25/20 0232  CHOL 133  HDL 51  LDLCALC 67  TRIG 75  CHOLHDL 2.6   Thyroid Function Tests No results for input(s): TSH, T4TOTAL, T3FREE, THYROIDAB in the last 72 hours.  Invalid input(s): FREET3 _____________  DG Chest 2 View  Result Date: 02/22/2020 CLINICAL DATA:  Onset chest pain and shortness of breath this morning. EXAM: CHEST - 2 VIEW COMPARISON:  None. FINDINGS: Asymmetric elevation of the right hemidiaphragm relative to the left noted. There is minimal left basilar atelectasis. Lungs otherwise clear. Heart size is normal. Aortic atherosclerosis noted. No pneumothorax or pleural fluid. No acute or focal bony abnormality. IMPRESSION: No acute disease. Aortic Atherosclerosis (ICD10-I70.0). Electronically Signed   By: Inge Rise M.D.   On: 02/22/2020 13:38   CARDIAC CATHETERIZATION  Addendum Date: 02/23/2020    Ost RCA to Prox RCA lesion is 60% stenosed.  Prox RCA to Mid RCA lesion  is 100% stenosed.  Mid RCA to Dist RCA lesion is 50% stenosed.  Ost Cx to Prox Cx lesion is 100% stenosed.  Mid LAD-1 lesion is 30% stenosed.  Mid LAD-2 lesion is 100% stenosed.  Mid LAD to Dist LAD lesion is 50% stenosed.  Balloon angioplasty was performed using a BALLOON SAPPHIRE 2.5X12.  Post intervention, there is a 60% residual stenosis.  Post intervention, there is a 50% residual stenosis.  Ramus lesion is 90% stenosed.  1. The LAD courses to the apex. The most proximal stent in the mid LAD is patent but the vessel is occluded beyond this stent. The distal vessel is seen to fill from left to left bridging collaterals. 2. Large intermediate branch with severe mid stenosis. 3. Chronically occluded proximal Circumflex. The Circumflex fills from left  to left collaterals on initial images and from right to left collaterals after the RCA was opened. 4. Thrombotic occlusion of the proximal to mid RCA. The RCA is a large dominant vessel with diffuse calcification of the proximal/mid/distal vessel. The distal RCA fills faintly from left to right collaterals. 5. Successful PTCA with balloon angioplasty only of the RCA. Flow restored. No stent placed. Recommendations: Will continue ASA, statin and beta blocker. I will hold Plavix. Continue Aggrastat drip for now. I will ask CT surgery to see him tomorrow to review his images and decide if he is a candidate for bypass surgery. Given the chronic occlusion of the Circumflex and LAD, full revascularization could not be achieved with PCI/stenting of the RCA. Any further intervention of the RCA would require atherectomy prior to stent placement. Echo in am.   Result Date: 02/22/2020  Ost RCA to Prox RCA lesion is 60% stenosed.  Prox RCA to Mid RCA lesion is 100% stenosed.  Mid RCA to Dist RCA lesion is 50% stenosed.  Ost Cx to Prox Cx lesion is 100% stenosed.  Mid LAD-1 lesion is 30% stenosed.  Mid LAD-2 lesion is 100% stenosed.  Mid LAD to Dist LAD lesion is  50% stenosed.  Balloon angioplasty was performed using a BALLOON SAPPHIRE 2.5X12.  Post intervention, there is a 60% residual stenosis.  Post intervention, there is a 50% residual stenosis.  Ramus lesion is 90% stenosed.  1. The LAD courses to the apex. The most proximal stent in the mid LAD is patent but the vessel is occluded beyond this stent. The distal vessel is seen to fill from left to left bridging collaterals. 2. Large intermediate branch with severe mid stenosis. 3. Chronically occluded proximal Circumflex. The Circumflex fills from left to left collaterals on initial images and from right to left collaterals after the RCA was opened. 4. Thrombotic occlusion of the proximal to mid RCA. The RCA is a large dominant vessel with diffuse calcification of the proximal/mid/distal vessel. The distal RCA fills faintly from left to right collaterals. 5. Successful PTCA with balloon angioplasty only of the RCA. Flow restored. No stent placed. Recommendations: Will continue ASA, statin and beta blocker. I will hold Plavix. Continue Aggrastat drip for now. I will ask CT surgery to see him tomorrow to review his images and decide if he is a candidate for bypass surgery. Given the chronic occlusion of the Circumflex and LAD, full revascularization could not be achieved with PCI/stenting of the RCA. Any further intervention of the RCA would require atherectomy prior to stent placement. Echo in am.   ECHOCARDIOGRAM COMPLETE  Result Date: 02/23/2020    ECHOCARDIOGRAM REPORT   Patient Name:   JONAEL PARADISO Date of Exam: 02/23/2020 Medical Rec #:  [id number]    Height:       68.0 in Accession #:    [id number]   Weight:       151.9 lb Date of Birth:  06-13-1936   BSA:          1.818 m Patient Age:    60 years     BP:           99/45 mmHg Patient Gender: M            HR:           49 bpm. Exam Location:  Inpatient Procedure: 2D Echo Indications:    CAD Native Vessel I25.10  History:        Patient has no prior  history of  Echocardiogram examinations.                 CAD; Risk Factors:Hypertension.  Sonographer:    Mikki Santee RDCS (AE) Referring Phys: [place]  1. Left ventricular ejection fraction, by estimation, is 55 to 60%. The left ventricle has normal function. The left ventricle has no regional wall motion abnormalities. There is mild concentric left ventricular hypertrophy. Left ventricular diastolic parameters are indeterminate.  2. Right ventricular systolic function is normal. The right ventricular size is normal.  3. The mitral valve is normal in structure. Trivial mitral valve regurgitation.  4. The aortic valve is tricuspid. Aortic valve regurgitation is mild.  5. Aortic dilatation noted. There is moderate dilatation of the ascending aorta measuring 44 mm.  6. The inferior vena cava is normal in size with greater than 50% respiratory variability, suggesting right atrial pressure of 3 mmHg. Comparison(s): No prior Echocardiogram. Conclusion(s)/Recommendation(s): Normal biventricular function without evidence of hemodynamically significant valvular heart disease. FINDINGS  Left Ventricle: Left ventricular ejection fraction, by estimation, is 55 to 60%. The left ventricle has normal function. The left ventricle has no regional wall motion abnormalities. The left ventricular internal cavity size was normal in size. There is  mild concentric left ventricular hypertrophy. Left ventricular diastolic parameters are indeterminate. Right Ventricle: The right ventricular size is normal. No increase in right ventricular wall thickness. Right ventricular systolic function is normal. Left Atrium: Left atrial size was normal in size. Right Atrium: Right atrial size was normal in size. Pericardium: There is no evidence of pericardial effusion. Presence of pericardial fat pad. Mitral Valve: The mitral valve is normal in structure. Trivial mitral valve regurgitation. Tricuspid Valve: The tricuspid  valve is normal in structure. Tricuspid valve regurgitation is trivial. Aortic Valve: The aortic valve is tricuspid. Aortic valve regurgitation is mild. Pulmonic Valve: The pulmonic valve was not well visualized. Pulmonic valve regurgitation is not visualized. Aorta: Aortic dilatation noted. There is moderate dilatation of the ascending aorta measuring 44 mm. Venous: The inferior vena cava is normal in size with greater than 50% respiratory variability, suggesting right atrial pressure of 3 mmHg. IAS/Shunts: The atrial septum is grossly normal.  LEFT VENTRICLE PLAX 2D LVIDd:         5.50 cm  Diastology LVIDs:         3.90 cm  LV e' lateral:   5.85 cm/s LV PW:         1.20 cm  LV E/e' lateral: 9.0 LV IVS:        1.50 cm  LV e' medial:    3.45 cm/s LVOT diam:     2.30 cm  LV E/e' medial:  15.3 LV SV:         76 LV SV Index:   42 LVOT Area:     4.15 cm  RIGHT VENTRICLE RV S prime:     14.90 cm/s TAPSE (M-mode): 2.3 cm LEFT ATRIUM             Index       RIGHT ATRIUM           Index LA diam:        3.00 cm 1.65 cm/m  RA Area:     14.80 cm LA Vol (A2C):   45.5 ml 25.02 ml/m RA Volume:   34.50 ml  18.97 ml/m LA Vol (A4C):   25.2 ml 13.86 ml/m LA Biplane Vol: 34.5 ml 18.97 ml/m  AORTIC VALVE  LVOT Vmax:   80.30 cm/s LVOT Vmean:  50.400 cm/s LVOT VTI:    0.182 m  AORTA Ao Root diam: 3.50 cm Ao Asc diam:  4.40 cm MITRAL VALVE MV Area (PHT): 1.75 cm    SHUNTS MV Decel Time: 433 msec    Systemic VTI:  0.18 m MV E velocity: 52.70 cm/s  Systemic Diam: 2.30 cm MV A velocity: 57.40 cm/s MV E/A ratio:  0.92 Buford Dresser MD Electronically signed by Buford Dresser MD Signature Date/Time: 02/23/2020/7:41:28 PM    Final    Disposition   Patient is being discharged home today in good condition.  Follow-up Plans & Appointments     Follow-up Information    [facility] Follow up.   Specialty: Home Health Services Why: Alvis Lemmings will be providing a home health nurse. You should receive a call in  the next 24-48 hours for home health visits for teaching. Contact information: [address] [address] [phone]        Burtis Junes, NP Follow up.   Specialties: Nurse Practitioner, Interventional Cardiology, Cardiology, Radiology Why: Hospital follow-up scheduled for 03/07/2020 at 9:40am with Truitt Merle, one of Dr. Kingsley Plan NPs. Please arrive 15 minutes early for check-in. If this date/time does not work for you, please call our office to reschedule. Contact information: [address] [place] [address] [phone]              Discharge Instructions    Amb Referral to Cardiac Rehabilitation   Complete by: As directed    Diagnosis:  Coronary Stents PTCA     After initial evaluation and assessments completed: Virtual Based Care may be provided alone or in conjunction with Phase 2 Cardiac Rehab based on patient barriers.: Yes   Diet - low sodium heart healthy   Complete by: As directed    Increase activity slowly   Complete by: As directed       Discharge Medications   Allergies as of 02/25/2020      Reactions   Codeine Nausea Only   Lidoderm [lidocaine]    Paxil [paroxetine Hcl]       Medication List    STOP taking these medications   clopidogrel 75 MG tablet Commonly known as: PLAVIX     TAKE these medications   ALLEGRA PO Take 1 tablet by mouth daily as needed (for allergy).   aspirin 81 MG chewable tablet Chew 1 tablet (81 mg total) by mouth daily. Start taking on: February 26, 2020   atenolol 50 MG tablet Commonly known as: TENORMIN Take 1 tablet (50 mg total) by mouth at bedtime. What changed: when to take this   buprenorphine 20 MCG/HR Ptwk Commonly known as: BUTRANS Place 20 mcg onto the skin every 7 (seven) days.   CoQ10 200 MG Caps Take 200 mg by mouth daily.   cyclobenzaprine 10 MG tablet Commonly known as: FLEXERIL Take 10 mg by mouth 3 (three) times daily as needed for muscle  spasms.   Dexilant 60 MG capsule Generic drug: dexlansoprazole Take 60 mg by mouth daily.   dextromethorphan-guaiFENesin 30-600 MG 12hr tablet Commonly known as: MUCINEX DM Take 2 tablets by mouth every 12 (twelve) hours as needed for cough.   DULoxetine 20 MG capsule Commonly known as: CYMBALTA Take 10 mg by mouth daily.   FISH OIL PO Take 1 capsule by mouth daily.   HYDROcodone-acetaminophen 10-325 MG tablet Commonly known as: NORCO Take 1 tablet  by mouth in the morning, at noon, and at bedtime.   isosorbide mononitrate 30 MG 24 hr tablet Commonly known as: IMDUR Take 1 tablet (30 mg total) by mouth daily. Start taking on: February 26, 2020   losartan 25 MG tablet Commonly known as: COZAAR Take 1 tablet (25 mg total) by mouth daily.   MiraLax 17 GM/SCOOP powder Generic drug: polyethylene glycol powder Take 1 Container by mouth daily.   multivitamin with minerals Tabs tablet Take 1 tablet by mouth daily. Centrum Silver   Nasonex 50 MCG/ACT nasal spray Generic drug: mometasone Place 2 sprays into the nose daily.   nitroGLYCERIN 0.4 MG SL tablet Commonly known as: NITROSTAT Place 0.4 mg under the tongue every 5 (five) minutes as needed for chest pain.   ROLAIDS PO Take 1 tablet by mouth daily as needed (Heart burn).   rosuvastatin 40 MG tablet Commonly known as: CRESTOR Take 1 tablet (40 mg total) by mouth daily.   ticagrelor 90 MG Tabs tablet Commonly known as: BRILINTA Take 1 tablet (90 mg total) by mouth 2 (two) times daily.   vitamin C 1000 MG tablet Take 1,000 mg by mouth daily.   Vitamin D3 250 MCG (10000 UT) Tabs Take 1,000 Units by mouth daily.          Outstanding Labs/Studies   Repeat BMET at follow-up visit.  Duration of Discharge Encounter   Greater than 30 minutes including physician time.  Signed, Darreld Mclean, PA-C 02/25/2020, 5:29 PM

## 2020-02-25 NOTE — Care Management (Signed)
Case management placed a benefits check for Brilinta 90 mg by mouth twice daily.  TOC admin to place the benefits cost in a note.

## 2020-02-25 NOTE — Discharge Instructions (Signed)
Medication Changes: - STOP Plavix and start Brilinta 90mg  twice daily. Take this in addition to baby Aspirin (81mg ) daily. - START Isosorbide mononitrate (Imdur) 30mg  daily. - DECREASE Atenolol to 50mg  once daily.  Please take all other medications as instructed elsewhere on paperwork.  Post STEMI: NO HEAVY LIFTING X 4 WEEKS. NO SEXUAL ACTIVITY X 4 WEEKS. NO DRIVING X 2 WEEKS. NO SOAKING BATHS, HOT TUBS, POOLS, ETC., X 7 DAYS.  Radial Site Care: Refer to this sheet in the next few weeks. These instructions provide you with information on caring for yourself after your procedure. Your caregiver may also give you more specific instructions. Your treatment has been planned according to current medical practices, but problems sometimes occur. Call your caregiver if you have any problems or questions after your procedure. HOME CARE INSTRUCTIONS  You may shower the day after the procedure.Remove the bandage (dressing) and gently wash the site with plain soap and water.Gently pat the site dry.   Do not apply powder or lotion to the site.   Do not submerge the affected site in water for 3 to 5 days.   Inspect the site at least twice daily.   Do not flex or bend the affected arm for 24 hours.   No lifting over 5 pounds (2.3 kg) for 5 days after your procedure.   Do not drive home if you are discharged the same day of the procedure. Have someone else drive you.   What to expect:  Any bruising will usually fade within 1 to 2 weeks.   Blood that collects in the tissue (hematoma) may be painful to the touch. It should usually decrease in size and tenderness within 1 to 2 weeks.  SEEK IMMEDIATE MEDICAL CARE IF:  You have unusual pain at the radial site.   You have redness, warmth, swelling, or pain at the radial site.   You have drainage (other than a small amount of blood on the dressing).   You have chills.   You have a fever or persistent symptoms for more than 72 hours.   You  have a fever and your symptoms suddenly get worse.   Your arm becomes pale, cool, tingly, or numb.   You have heavy bleeding from the site. Hold pressure on the site.

## 2020-02-25 NOTE — Progress Notes (Addendum)
Progress Note  Patient Name: Christopher Ferguson Date of Encounter: 02/25/2020  [facility] Cardiologist: Candee Furbish, MD   Subjective   No chest pain.   Inpatient Medications    Scheduled Meds: . aspirin  81 mg Oral Daily  . atenolol  50 mg Oral QHS  . Chlorhexidine Gluconate Cloth  6 each Topical Daily  . DULoxetine  20 mg Oral Daily  . fluticasone  1 spray Each Nare Daily  . HYDROcodone-acetaminophen  1 tablet Oral TID  . isosorbide mononitrate  30 mg Oral Daily  . losartan  25 mg Oral Daily  . pantoprazole  40 mg Oral Daily  . polyethylene glycol  17 g Oral Once  . rosuvastatin  40 mg Oral Daily  . sodium chloride flush  3 mL Intravenous Once  . sodium chloride flush  3 mL Intravenous Q12H  . ticagrelor  90 mg Oral BID   Continuous Infusions: . sodium chloride     PRN Meds: sodium chloride, acetaminophen, calcium carbonate, cyclobenzaprine, nitroGLYCERIN, ondansetron (ZOFRAN) IV, sodium chloride flush   Vital Signs    Vitals:   02/25/20 0300 02/25/20 0400 02/25/20 0500 02/25/20 0600  BP: 139/60 130/64 (!) 141/67   Pulse: (!) 57 (!) 55 (!) 58 (!) 58  Resp: 10 13 15 17   Temp:      TempSrc:      SpO2: 98% 95% 97% 98%  Weight:      Height:        Intake/Output Summary (Last 24 hours) at 02/25/2020 0753 Last data filed at 02/25/2020 0000 Gross per 24 hour  Intake --  Output 600 ml  Net -600 ml   Last 3 Weights 02/24/2020 02/22/2020 01/18/2020  Weight (lbs) 146 lb 1.6 oz 151 lb 14.4 oz 152 lb  Weight (kg) 66.271 kg 68.9 kg 68.947 kg      Telemetry    Sinus- Personally Reviewed  ECG    No AM EKG- Personally Reviewed  Physical Exam   General: Well developed, well nourished, NAD  HEENT: OP clear, mucus membranes moist  SKIN: warm, dry. No rashes. Neuro: No focal deficits  Musculoskeletal: Muscle strength 5/5 all ext  Psychiatric: Mood and affect normal  Neck: No JVD Lungs:Clear bilaterally, no wheezes, rhonci, crackles Cardiovascular: Regular rate  and rhythm. No murmurs, gallops or rubs. Abdomen:Soft. Bowel sounds present. Non-tender.  Extremities: No lower extremity edema.    Labs    High Sensitivity Troponin:   Recent Labs  Lab 02/22/20 1308 02/22/20 1454 02/22/20 1857 02/22/20 2205 02/23/20 0235  TROPONINIHS 33* 39* >27,000* >27,000* >27,000*      Chemistry Recent Labs  Lab 02/23/20 0235 02/24/20 0046 02/25/20 0232  NA 132* 130* 129*  K 4.2 4.8 4.2  CL 98 96* 95*  CO2 24 25 23   GLUCOSE 115* 118* 105*  BUN 13 14 20   CREATININE 0.79 0.71 0.78  CALCIUM 9.2 9.1 9.1  GFRNONAA >60 >60 >60  GFRAA >60 >60 >60  ANIONGAP 10 9 11      Hematology Recent Labs  Lab 02/22/20 1308 02/23/20 0235 02/24/20 0046  WBC 9.5  9.3 11.9* 9.7  RBC 3.52*  3.58* 3.52* 3.26*  HGB 11.6*  11.6* 11.5* 11.0*  HCT 34.5*  35.1* 34.0* 31.9*  MCV 98.0  98.0 96.6 97.9  MCH 33.0  32.4 32.7 33.7  MCHC 33.6  33.0 33.8 34.5  RDW 12.4  12.3 12.3 12.1  PLT 250  262 250 225    BNPNo results for input(s): BNP,  PROBNP in the last 168 hours.   DDimer No results for input(s): DDIMER in the last 168 hours.   Radiology    ECHOCARDIOGRAM COMPLETE  Result Date: 02/23/2020    ECHOCARDIOGRAM REPORT   Patient Name:   Christopher Ferguson Date of Exam: 02/23/2020 Medical Rec #:  [id number]    Height:       68.0 in Accession #:    [id number]   Weight:       151.9 lb Date of Birth:  1936/06/13   BSA:          1.818 m Patient Age:    84 years     BP:           99/45 mmHg Patient Gender: M            HR:           49 bpm. Exam Location:  Inpatient Procedure: 2D Echo Indications:    CAD Native Vessel I25.10  History:        Patient has no prior history of Echocardiogram examinations.                 CAD; Risk Factors:Hypertension.  Sonographer:    Mikki Santee RDCS (AE) Referring Phys: [place]  1. Left ventricular ejection fraction, by estimation, is 55 to 60%. The left ventricle has normal function. The left ventricle  has no regional wall motion abnormalities. There is mild concentric left ventricular hypertrophy. Left ventricular diastolic parameters are indeterminate.  2. Right ventricular systolic function is normal. The right ventricular size is normal.  3. The mitral valve is normal in structure. Trivial mitral valve regurgitation.  4. The aortic valve is tricuspid. Aortic valve regurgitation is mild.  5. Aortic dilatation noted. There is moderate dilatation of the ascending aorta measuring 44 mm.  6. The inferior vena cava is normal in size with greater than 50% respiratory variability, suggesting right atrial pressure of 3 mmHg. Comparison(s): No prior Echocardiogram. Conclusion(s)/Recommendation(s): Normal biventricular function without evidence of hemodynamically significant valvular heart disease. FINDINGS  Left Ventricle: Left ventricular ejection fraction, by estimation, is 55 to 60%. The left ventricle has normal function. The left ventricle has no regional wall motion abnormalities. The left ventricular internal cavity size was normal in size. There is  mild concentric left ventricular hypertrophy. Left ventricular diastolic parameters are indeterminate. Right Ventricle: The right ventricular size is normal. No increase in right ventricular wall thickness. Right ventricular systolic function is normal. Left Atrium: Left atrial size was normal in size. Right Atrium: Right atrial size was normal in size. Pericardium: There is no evidence of pericardial effusion. Presence of pericardial fat pad. Mitral Valve: The mitral valve is normal in structure. Trivial mitral valve regurgitation. Tricuspid Valve: The tricuspid valve is normal in structure. Tricuspid valve regurgitation is trivial. Aortic Valve: The aortic valve is tricuspid. Aortic valve regurgitation is mild. Pulmonic Valve: The pulmonic valve was not well visualized. Pulmonic valve regurgitation is not visualized. Aorta: Aortic dilatation noted. There is  moderate dilatation of the ascending aorta measuring 44 mm. Venous: The inferior vena cava is normal in size with greater than 50% respiratory variability, suggesting right atrial pressure of 3 mmHg. IAS/Shunts: The atrial septum is grossly normal.  LEFT VENTRICLE PLAX 2D LVIDd:         5.50 cm  Diastology LVIDs:         3.90 cm  LV e' lateral:   5.85 cm/s LV PW:  1.20 cm  LV E/e' lateral: 9.0 LV IVS:        1.50 cm  LV e' medial:    3.45 cm/s LVOT diam:     2.30 cm  LV E/e' medial:  15.3 LV SV:         76 LV SV Index:   42 LVOT Area:     4.15 cm  RIGHT VENTRICLE RV S prime:     14.90 cm/s TAPSE (M-mode): 2.3 cm LEFT ATRIUM             Index       RIGHT ATRIUM           Index LA diam:        3.00 cm 1.65 cm/m  RA Area:     14.80 cm LA Vol (A2C):   45.5 ml 25.02 ml/m RA Volume:   34.50 ml  18.97 ml/m LA Vol (A4C):   25.2 ml 13.86 ml/m LA Biplane Vol: 34.5 ml 18.97 ml/m  AORTIC VALVE LVOT Vmax:   80.30 cm/s LVOT Vmean:  50.400 cm/s LVOT VTI:    0.182 m  AORTA Ao Root diam: 3.50 cm Ao Asc diam:  4.40 cm MITRAL VALVE MV Area (PHT): 1.75 cm    SHUNTS MV Decel Time: 433 msec    Systemic VTI:  0.18 m MV E velocity: 52.70 cm/s  Systemic Diam: 2.30 cm MV A velocity: 57.40 cm/s MV E/A ratio:  0.92 Buford Dresser MD Electronically signed by Buford Dresser MD Signature Date/Time: 02/23/2020/7:41:28 PM    Final     Cardiac Studies   Cardiac cath 02/22/20:  Colon Flattery RCA to Prox RCA lesion is 60% stenosed.  Prox RCA to Mid RCA lesion is 100% stenosed.  Mid RCA to Dist RCA lesion is 50% stenosed.  Ost Cx to Prox Cx lesion is 100% stenosed.  Mid LAD-1 lesion is 30% stenosed.  Mid LAD-2 lesion is 100% stenosed.  Mid LAD to Dist LAD lesion is 50% stenosed.  Balloon angioplasty was performed using a BALLOON SAPPHIRE 2.5X12.  Post intervention, there is a 60% residual stenosis.  Post intervention, there is a 50% residual stenosis.  Ramus lesion is 90% stenosed.   1. The LAD courses to  the apex. The most proximal stent in the mid LAD is patent but the vessel is occluded beyond this stent. The distal vessel is seen to fill from left to left bridging collaterals.  2. Large intermediate branch with severe mid stenosis.  3. Chronically occluded proximal Circumflex. The Circumflex fills from left to left collaterals on initial images and from right to left collaterals after the RCA was opened.  4. Thrombotic occlusion of the proximal to mid RCA. The RCA is a large dominant vessel with diffuse calcification of the proximal/mid/distal vessel. The distal RCA fills faintly from left to right collaterals.  5. Successful PTCA with balloon angioplasty only of the RCA. Flow restored. No stent placed.   Echo 02/24/20:  1. Left ventricular ejection fraction, by estimation, is 55 to 60%. The  left ventricle has normal function. The left ventricle has no regional  wall motion abnormalities. There is mild concentric left ventricular  hypertrophy. Left ventricular diastolic  parameters are indeterminate.  2. Right ventricular systolic function is normal. The right ventricular  size is normal.  3. The mitral valve is normal in structure. Trivial mitral valve  regurgitation.  4. The aortic valve is tricuspid. Aortic valve regurgitation is mild.  5. Aortic dilatation noted. There is  moderate dilatation of the ascending  aorta measuring 44 mm.  6. The inferior vena cava is normal in size with greater than 50%  respiratory variability, suggesting right atrial pressure of 3 mmHg.    Patient Profile     84 yo male with history of chronic back pain, CAD s/p coronary stenting in 2001 and 2009, chronically occluded Circumflex, prior CVA, HLD, HTN, RBBB and GERD who presented to the ED today with c/o chest pain and dyspnea for 2-3 hours. The pain radiates from his back to his chest and started around 11 am today. The pain is similar to his prior anginal pain. First EKG in the ED with no clear ST  elevation. Repeat EKG with inferior ST elevation. Code STEMI called. RCA was occluded and treated with balloon angioplasty. No stent placed in the RCA due to heavy calcification.   Assessment & Plan    1. CAD/Acute inferior STEMI: Pt admitted 02/22/20 with an acute inferior STEMI. His RCA was occluded proximally. The RCA is a large dominant vessel with heavy calcification and diffuse disease throughout the entire vessel. The previously stented LAD is chronically occluded in the mid segment. The non-dominant Circumflex is chronically occluded. I have reviewed his cath films and presentation with our Heart team including two CT surgeons and interventional cardiology. His coronary anatomy is not felt to be favorable for CABG as his targets in the LAD and Circumflex are poor. The distal RCA branches are diffusely diseased and poor targets. CABG is not felt to be an option.   From an interventional cardiology standpoint, the RCA is heavily calcified and diffusely diseased. We cannot perform atherectomy of the RCA yet given the recent balloon angioplasty. I have reviewed the case with our CTO team and it is a consideration to bring him back in a month for a complex CTO PCI of the LAD and relook at the RCA at that time. Will plan f/u with Dr. Martinique in the interventional clinic to discuss further.   Echo with normal LV systolic function.   Continue medical management of CAD with ASA, Brilinta, statin and beta blocker. Atenolol reduced on 02/24/20 due to bradycardia.   Will discharge home today after lunch. Pt to ambulate this am.  I will see if there is any way we can get him in to see Dr. Martinique over the next three weeks in order to potentially perform CTO PCI of the LAD in mid August.  He will need one week post hospital follow up with an office APP.   2. Hyponatremia: Stable. Will need repeat BMET at office follow up     For questions or updates, please contact [place] Please consult  [email or web address] for contact info under        Signed, Lauree Chandler, MD  02/25/2020, 7:53 AM

## 2020-02-25 NOTE — Progress Notes (Signed)
I met with Christopher Ferguson and Christopher Ferguson. I reviewed Christopher history and cardiac cath films with Dr Angelena Form. He had emergent POBA of the RCA for acute infarction. The RCA is severely calcified and may require definitive therapy with atherectomy and stenting once the vessel has had time to heal. He has CTO of the mid LAD in between 2 prior stents. The LCx is also occluded but this area of myocardium is nonviable.   I spoke to him about CTO PCI of the LAD. I think this is favorable with success rate of 80-85%. The procedure and risks were reviewed including but not limited to death, myocardial infarction, perforation, stroke, arrythmias, bleeding, transfusion, emergency surgery, dye allergy, or renal dysfunction. The patient voices understanding and is agreeable to proceed. Will plan on Returning for this procedure on August 18. Will relook at the RCA at that time as well.  Christopher Donahey Martinique MD, [facility]

## 2020-02-25 NOTE — Plan of Care (Signed)
  Problem: Education: Goal: Understanding of CV disease, CV risk reduction, and recovery process will improve Outcome: Adequate for Discharge Goal: Individualized Educational Video(s) Outcome: Adequate for Discharge   Problem: Activity: Goal: Ability to return to baseline activity level will improve Outcome: Adequate for Discharge   Problem: Cardiovascular: Goal: Ability to achieve and maintain adequate cardiovascular perfusion will improve Outcome: Adequate for Discharge Goal: Vascular access site(s) Level 0-1 will be maintained Outcome: Adequate for Discharge   Problem: Health Behavior/Discharge Planning: Goal: Ability to safely manage health-related needs after discharge will improve Outcome: Adequate for Discharge   Problem: Education: Goal: Knowledge of General Education information will improve Description: Including pain rating scale, medication(s)/side effects and non-pharmacologic comfort measures Outcome: Adequate for Discharge   Problem: Health Behavior/Discharge Planning: Goal: Ability to manage health-related needs will improve Outcome: Adequate for Discharge   Problem: Clinical Measurements: Goal: Ability to maintain clinical measurements within normal limits will improve Outcome: Adequate for Discharge Goal: Will remain free from infection Outcome: Adequate for Discharge Goal: Diagnostic test results will improve Outcome: Adequate for Discharge Goal: Respiratory complications will improve Outcome: Adequate for Discharge Goal: Cardiovascular complication will be avoided Outcome: Adequate for Discharge   Problem: Activity: Goal: Risk for activity intolerance will decrease Outcome: Adequate for Discharge   Problem: Nutrition: Goal: Adequate nutrition will be maintained Outcome: Adequate for Discharge   Problem: Coping: Goal: Level of anxiety will decrease Outcome: Adequate for Discharge   Problem: Elimination: Goal: Will not experience complications  related to bowel motility Outcome: Adequate for Discharge Goal: Will not experience complications related to urinary retention Outcome: Adequate for Discharge   Problem: Pain Managment: Goal: General experience of comfort will improve Outcome: Adequate for Discharge   Problem: Safety: Goal: Ability to remain free from injury will improve Outcome: Adequate for Discharge   Problem: Skin Integrity: Goal: Risk for impaired skin integrity will decrease Outcome: Adequate for Discharge   Problem: Education: Goal: Understanding of cardiac disease, CV risk reduction, and recovery process will improve Outcome: Adequate for Discharge Goal: Understanding of medication regimen will improve Outcome: Adequate for Discharge Goal: Individualized Educational Video(s) Outcome: Adequate for Discharge   Problem: Activity: Goal: Ability to tolerate increased activity will improve Outcome: Adequate for Discharge   Problem: Cardiac: Goal: Ability to achieve and maintain adequate cardiopulmonary perfusion will improve Outcome: Adequate for Discharge Goal: Vascular access site(s) Level 0-1 will be maintained Outcome: Adequate for Discharge   Problem: Health Behavior/Discharge Planning: Goal: Ability to safely manage health-related needs after discharge will improve Outcome: Adequate for Discharge

## 2020-02-27 DIAGNOSIS — I4510 Unspecified right bundle-branch block: Secondary | ICD-10-CM | POA: Diagnosis not present

## 2020-02-27 DIAGNOSIS — Z48812 Encounter for surgical aftercare following surgery on the circulatory system: Secondary | ICD-10-CM | POA: Diagnosis not present

## 2020-02-27 DIAGNOSIS — R001 Bradycardia, unspecified: Secondary | ICD-10-CM | POA: Diagnosis not present

## 2020-02-27 DIAGNOSIS — I69398 Other sequelae of cerebral infarction: Secondary | ICD-10-CM | POA: Diagnosis not present

## 2020-02-27 DIAGNOSIS — I2111 ST elevation (STEMI) myocardial infarction involving right coronary artery: Secondary | ICD-10-CM | POA: Diagnosis not present

## 2020-02-27 DIAGNOSIS — I444 Left anterior fascicular block: Secondary | ICD-10-CM | POA: Diagnosis not present

## 2020-02-27 DIAGNOSIS — I119 Hypertensive heart disease without heart failure: Secondary | ICD-10-CM | POA: Diagnosis not present

## 2020-02-27 DIAGNOSIS — I2510 Atherosclerotic heart disease of native coronary artery without angina pectoris: Secondary | ICD-10-CM | POA: Diagnosis not present

## 2020-02-27 DIAGNOSIS — I700 Atherosclerosis of aorta: Secondary | ICD-10-CM | POA: Diagnosis not present

## 2020-03-01 DIAGNOSIS — I69398 Other sequelae of cerebral infarction: Secondary | ICD-10-CM | POA: Diagnosis not present

## 2020-03-01 DIAGNOSIS — I2510 Atherosclerotic heart disease of native coronary artery without angina pectoris: Secondary | ICD-10-CM | POA: Diagnosis not present

## 2020-03-01 DIAGNOSIS — Z48812 Encounter for surgical aftercare following surgery on the circulatory system: Secondary | ICD-10-CM | POA: Diagnosis not present

## 2020-03-01 DIAGNOSIS — I700 Atherosclerosis of aorta: Secondary | ICD-10-CM | POA: Diagnosis not present

## 2020-03-01 DIAGNOSIS — I4510 Unspecified right bundle-branch block: Secondary | ICD-10-CM | POA: Diagnosis not present

## 2020-03-01 DIAGNOSIS — I2111 ST elevation (STEMI) myocardial infarction involving right coronary artery: Secondary | ICD-10-CM | POA: Diagnosis not present

## 2020-03-01 DIAGNOSIS — R001 Bradycardia, unspecified: Secondary | ICD-10-CM | POA: Diagnosis not present

## 2020-03-01 DIAGNOSIS — I444 Left anterior fascicular block: Secondary | ICD-10-CM | POA: Diagnosis not present

## 2020-03-01 DIAGNOSIS — I119 Hypertensive heart disease without heart failure: Secondary | ICD-10-CM | POA: Diagnosis not present

## 2020-03-01 NOTE — H&P (View-Only) (Signed)
CARDIOLOGY OFFICE NOTE  Date:  03/07/2020    Christopher Ferguson Date of Birth: 1935/08/31 Medical Record #[id number]  PCP:  Kristen Loader, FNP  Cardiologist:  [facility]   Chief Complaint  Patient presents with  . Hospitalization Follow-up    Seen for Dr. Marlou Porch    History of Present Illness: Christopher Ferguson is a 84 y.o. male who presents today for a post hospital visit. Seen for Dr. Marlou Porch.   He has a history of known CAD with prior stenting in 2001 and 2009 and known CTO of circumflex, RBBB, prior CVA, hypertension, hyperlipidemia, and GERD.   Last seen by Dr. Marlou Porch in early June.   Presented to the ER earlier this month with chest pain and dyspnea. 2nd EKG with ST elevation. He was taken to cath lab for emergent cardiac catheterization. This showed severe multivessel disease with 60% of the ostial to proximal RCA followed by 100% thrombotic occlusion of proximal to mid RCA and 50% stenosis of the mid to distal RCA as well as 100% stenosis of the ostial to proximal CX, and 100% stenosis stenosis of mid LAD. The most proximal stent in the mid LAD is patent and the vessel is occluded beyond this stent. Distal vessel does have left to left bridging collaterals. CX also fills from collaterals. RCA treated with balloon angioplasty and flow was restored. No stent was placed.  Echo showed LVEF of 55-60% with normal wall motion, mild AI, and moderate dilatation of ascending aorta measuring 63mm. Cath films reviewed with CT surgery; however, not felt to be favorable for CABG due to poor targets. From an intervention cardiology standpoint, RCA is heavily calcified and diffusely diseased. Atherectomy of RCA cannot be performed yet given recent balloon angioplasty. Dr. Angelena Form reviewed case with our CTO team and patient was to come back for CTO/ PCI of the LAD and relook at the RCA at that time. Will continue medical management during this time. Home Plavix was stopped and he was started on Brilinta  90mg  twice daily in addition to Aspirin 81mg  daily.  Atenolol reduced to 50mg  due to bradycardia.CTO/PCI has been planned for mid August.   Comes in today. Here with his wife. She augments the history. They seem overwhelmed with the recent events.  He has chronic foot dropsy from prior back surgery - now using a walker due to fear of falling with "all the blood thinner that he is on". Balance seems to be an issue. He would like to resume driving. He is followed thru the pain clinic chronically. He has injections for his back every 3 months - this is for his chronic pain - he is asking about how he is to continue this. He is on multiple narcotics as well. No further chest pain reported since discharge. They are trying to follow more careful diet. No bleeding noted. No falls. No more chest pain. He is asking me about his life expectancy.   Past Medical History:  Diagnosis Date  . AR (allergic rhinitis)   . CAD S/P percutaneous coronary angioplasty    3 stents on '01, one stent in '09, known CTO FCX  . Chronic back pain   . Constipation, unspecified 06/09/2009  . CVA, old, alterations of sensations   . Degeneration of cervical intervertebral disc   . Degeneration of lumbar or lumbosacral intervertebral disc 06/10/2007  . Depression   . Diverticulitis of colon without hemorrhage   . Dyslipidemia   . Epidural abscess   .  GERD (gastroesophageal reflux disease)   . Hypertension   . RBBB with left anterior fascicular block   . Spinal stenosis    HAD SURGERY    Past Surgical History:  Procedure Laterality Date  . BACK SURGERY    . CORONARY/GRAFT ACUTE MI REVASCULARIZATION N/A 02/22/2020   Procedure: Coronary/Graft Acute MI Revascularization;  Surgeon: Burnell Blanks, MD;  Location: [facility];  Service: Cardiovascular;  Laterality: N/A;  . LAMINECTOMY  2009   OF C6-C7  . LEFT HEART CATH AND CORONARY ANGIOGRAPHY N/A 02/22/2020   Procedure: LEFT HEART CATH AND CORONARY  ANGIOGRAPHY;  Surgeon: Burnell Blanks, MD;  Location: [facility];  Service: Cardiovascular;  Laterality: N/A;  . LUMBAR [place]    . MOUTH SURGERY       Medications: Current Meds  Medication Sig  . Alum Hydroxide-Mag Carbonate (GAVISCON EXTRA STRENGTH PO) Take by mouth.  . Ascorbic Acid (VITAMIN C) 1000 MG tablet Take 1,000 mg by mouth daily.  Marland Kitchen aspirin 81 MG chewable tablet Chew 1 tablet (81 mg total) by mouth daily.  Marland Kitchen atenolol (TENORMIN) 50 MG tablet Take 1 tablet (50 mg total) by mouth at bedtime.  . buprenorphine (BUTRANS - DOSED MCG/HR) 20 MCG/HR PTWK patch Place 20 mcg onto the skin every 7 (seven) days.   . Ca Carbonate-Mag Hydroxide (ROLAIDS PO) Take 1 tablet by mouth daily as needed (Heart burn).   . Chlorpheniramine-Acetaminophen (CORICIDIN HBP COLD/FLU PO) Take by mouth.  . Cholecalciferol (VITAMIN D3) 10000 units TABS Take 1,000 Units by mouth daily.  . Coenzyme Q10 (COQ10) 200 MG CAPS Take 200 mg by mouth daily.  . cyclobenzaprine (FLEXERIL) 10 MG tablet Take 10 mg by mouth 3 (three) times daily as needed for muscle spasms.  Marland Kitchen dextromethorphan-guaiFENesin (MUCINEX DM) 30-600 MG 12hr tablet Take 2 tablets by mouth every 12 (twelve) hours as needed for cough.   . DULoxetine (CYMBALTA) 20 MG capsule Take 10 mg by mouth daily.  Marland Kitchen Fexofenadine HCl (ALLEGRA PO) Take 1 tablet by mouth daily as needed (for allergy).   . Flaxseed, Linseed, (FLAX SEED OIL) 1000 MG CAPS Take by mouth.  Marland Kitchen HYDROcodone-acetaminophen (NORCO) 10-325 MG tablet Take 1 tablet by mouth in the morning, at noon, and at bedtime.   . isosorbide mononitrate (IMDUR) 30 MG 24 hr tablet Take 1 tablet (30 mg total) by mouth daily.  . mometasone (NASONEX) 50 MCG/ACT nasal spray Place 2 sprays into the nose daily.  . Multiple Vitamin (MULTIVITAMIN WITH MINERALS) TABS tablet Take 1 tablet by mouth daily. Centrum Silver  . nitroGLYCERIN (NITROSTAT) 0.4 MG SL tablet Place 0.4 mg under the tongue every 5  (five) minutes as needed for chest pain.  . Omega-3 Fatty Acids ([employer] NATURAL FISH OIL) 1200 MG CAPS Take by mouth.  . Omega-3 Fatty Acids (FISH OIL PO) Take 1 capsule by mouth daily.  . polyethylene glycol powder (MIRALAX) powder Take 1 Container by mouth daily.   . rosuvastatin (CRESTOR) 40 MG tablet Take 1 tablet (40 mg total) by mouth daily.  . ticagrelor (BRILINTA) 90 MG TABS tablet Take 1 tablet (90 mg total) by mouth 2 (two) times daily.  Loura Pardon Salicylate (BLUE-EMU HEMP EX) Apply topically.     Allergies: Allergies  Allergen Reactions  . Codeine Nausea Only  . Lidoderm [Lidocaine]   . Paxil [Paroxetine Hcl]     Social History: The patient  reports that he has never smoked. He has never used smokeless tobacco.  Family History: The patient's family history is not on file.   Review of Systems: Please see the history of present illness.   All other systems are reviewed and negative.   Physical Exam: VS:  BP (!) 116/62   Ht 5\' 6"  (1.676 m)   Wt 151 lb 6.4 oz (68.7 kg)   SpO2 98%   BMI 24.44 kg/m  .  BMI Body mass index is 24.44 kg/m.  Wt Readings from Last 3 Encounters:  03/07/20 151 lb 6.4 oz (68.7 kg)  02/24/20 146 lb 1.6 oz (66.3 kg)  01/18/20 152 lb (68.9 kg)    General: Pleasant. Elderly. Seems frail to me but alert and in no acute distress.   Cardiac: Regular rate and rhythm. No murmurs, rubs, or gallops. No edema.  Respiratory:  Lungs are clear to auscultation bilaterally with normal work of breathing.  GI: Soft and nontender.  MS: No deformity or atrophy. Gait and ROM intact. He is using a walker.  Skin: Warm and dry. Color is normal. Some bruising.  Neuro:  Strength and sensation are intact and no gross focal deficits noted.  Psych: Alert, appropriate and with normal affect.   LABORATORY DATA:  EKG:  EKG is ordered today.  Personally reviewed by me. This demonstrates sinus - 1st degree AV block, RBBB - LVH.  Lab Results  Component Value Date    WBC 9.7 02/24/2020   HGB 11.0 (L) 02/24/2020   HCT 31.9 (L) 02/24/2020   PLT 225 02/24/2020   GLUCOSE 105 (H) 02/25/2020   CHOL 133 02/25/2020   TRIG 75 02/25/2020   HDL 51 02/25/2020   LDLCALC 67 02/25/2020   NA 129 (L) 02/25/2020   K 4.2 02/25/2020   CL 95 (L) 02/25/2020   CREATININE 0.78 02/25/2020   BUN 20 02/25/2020   CO2 23 02/25/2020   HGBA1C 5.2 02/25/2020     BNP (last 3 results) No results for input(s): BNP in the last 8760 hours.  ProBNP (last 3 results) No results for input(s): PROBNP in the last 8760 hours.   Other Studies Reviewed Today:  Left Heart Catheterization 02/22/2020:  Ost RCA to Prox RCA lesion is 60% stenosed.  Prox RCA to Mid RCA lesion is 100% stenosed.  Mid RCA to Dist RCA lesion is 50% stenosed.  Ost Cx to Prox Cx lesion is 100% stenosed.  Mid LAD-1 lesion is 30% stenosed.  Mid LAD-2 lesion is 100% stenosed.  Mid LAD to Dist LAD lesion is 50% stenosed.  Balloon angioplasty was performed using a BALLOON SAPPHIRE 2.5X12.  Post intervention, there is a 60% residual stenosis.  Post intervention, there is a 50% residual stenosis.  Ramus lesion is 90% stenosed.  1. The LAD courses to the apex. The most proximal stent in the mid LAD is patent but the vessel is occluded beyond this stent. The distal vessel is seen to fill from left to left bridging collaterals.  2. Large intermediate branch with severe mid stenosis.  3. Chronically occluded proximal Circumflex. The Circumflex fills from left to left collaterals on initial images and from right to left collaterals after the RCA was opened.  4. Thrombotic occlusion of the proximal to mid RCA. The RCA is a large dominant vessel with diffuse calcification of the proximal/mid/distal vessel. The distal RCA fills faintly from left to right collaterals.  5. Successful PTCA with balloon angioplasty only of the RCA. Flow restored. No stent placed.   Recommendations: Will continue ASA, statin  and beta blocker.  I will hold Plavix. Continue Aggrastat drip for now. I will ask CT surgery to see him tomorrow to review his images and decide if he is a candidate for bypass surgery. Given the chronic occlusion of the Circumflex and LAD, full revascularization could not be achieved with PCI/stenting of the RCA. Any further intervention of the RCA would require atherectomy prior to stent placement. Echo in am.   Diagnostic Dominance: Right  Intervention     _____________   Echocardiogram 02/23/2020: Impressions: 1. Left ventricular ejection fraction, by estimation, is 55 to 60%. The  left ventricle has normal function. The left ventricle has no regional  wall motion abnormalities. There is mild concentric left ventricular  hypertrophy. Left ventricular diastolic  parameters are indeterminate.  2. Right ventricular systolic function is normal. The right ventricular  size is normal.  3. The mitral valve is normal in structure. Trivial mitral valve  regurgitation.  4. The aortic valve is tricuspid. Aortic valve regurgitation is mild.  5. Aortic dilatation noted. There is moderate dilatation of the ascending  aorta measuring 44 mm.  6. The inferior vena cava is normal in size with greater than 50%  respiratory variability, suggesting right atrial pressure of 3 mmHg.   Comparison(s): No prior Echocardiogram.   Conclusion(s)/Recommendation(s): Normal biventricular function without  evidence of hemodynamically significant valvular heart disease.    Assessment/Plan:  1. Recent STEMI with emergent cath - he has had balloon PCI to the RCA - he has had arthrectomy/CTO PCI planned for later next month. He was not a candidate for CABG given poor targets. Will still have residual disease that will need to be managed medically even if PCI/CTO is successful - no further chest pain reported. Lab today - will need repeat lab closer to his procedure date. Noted that the  risk/benefits/procedures were discussed with patient by Dr. Martinique. Will continue with DAPT.    2. Dilated ascending aorta - measures 44 mm - could consider outpatient CTA - this was not discussed with them today - would favor discussion of this on return and after repeat lab obtained.   3. HTN - BP is fine here today. No changes made today.   4. HLD - on high dose Crestor - LDL is at goal.   5. Hyponatremia - will recheck lab today.   6. Bradycardia/bifascicular block - on lower dose of beta blocker therapy - no worrisome symptoms noted but this will need to be followed. HR is acceptable today at 63.   7. Chronic back pain - followed in the pain clinic - explained that injections will not be able to continue for most likely the next 6 months following PCI.   Current medicines are reviewed with the patient today.  The patient does not have concerns regarding medicines other than what has been noted above.  The following changes have been made:  See above.  Labs/ tests ordered today include:    Orders Placed This Encounter  Procedures  . Basic metabolic panel  . CBC  . Comprehensive metabolic panel  . CBC  . EKG 12-Lead     Disposition:   FU with Dr. Marlou Porch towards the end of August.    Patient is agreeable to this plan and will call if any problems develop in the interim.   SignedTruitt Merle, NP  03/07/2020 9:56 AM  [facility] [address] [place], [place]  [address] Phone: [phone] Fax: [phone]

## 2020-03-01 NOTE — Progress Notes (Signed)
CARDIOLOGY OFFICE NOTE  Date:  03/07/2020    Christopher Ferguson Date of Birth: 11/07/1935 Medical Record #[id number]  PCP:  Kristen Loader, FNP  Cardiologist:  [facility]   Chief Complaint  Patient presents with  . Hospitalization Follow-up    Seen for Dr. Marlou Porch    History of Present Illness: Christopher Ferguson is a 84 y.o. male who presents today for a post hospital visit. Seen for Dr. Marlou Porch.   He has a history of known CAD with prior stenting in 2001 and 2009 and known CTO of circumflex, RBBB, prior CVA, hypertension, hyperlipidemia, and GERD.   Last seen by Dr. Marlou Porch in early June.   Presented to the ER earlier this month with chest pain and dyspnea. 2nd EKG with ST elevation. He was taken to cath lab for emergent cardiac catheterization. This showed severe multivessel disease with 60% of the ostial to proximal RCA followed by 100% thrombotic occlusion of proximal to mid RCA and 50% stenosis of the mid to distal RCA as well as 100% stenosis of the ostial to proximal CX, and 100% stenosis stenosis of mid LAD. The most proximal stent in the mid LAD is patent and the vessel is occluded beyond this stent. Distal vessel does have left to left bridging collaterals. CX also fills from collaterals. RCA treated with balloon angioplasty and flow was restored. No stent was placed.  Echo showed LVEF of 55-60% with normal wall motion, mild AI, and moderate dilatation of ascending aorta measuring 64mm. Cath films reviewed with CT surgery; however, not felt to be favorable for CABG due to poor targets. From an intervention cardiology standpoint, RCA is heavily calcified and diffusely diseased. Atherectomy of RCA cannot be performed yet given recent balloon angioplasty. Dr. Angelena Form reviewed case with our CTO team and patient was to come back for CTO/ PCI of the LAD and relook at the RCA at that time. Will continue medical management during this time. Home Plavix was stopped and he was started on Brilinta  90mg  twice daily in addition to Aspirin 81mg  daily.  Atenolol reduced to 50mg  due to bradycardia.CTO/PCI has been planned for mid August.   Comes in today. Here with his wife. She augments the history. They seem overwhelmed with the recent events.  He has chronic foot dropsy from prior back surgery - now using a walker due to fear of falling with "all the blood thinner that he is on". Balance seems to be an issue. He would like to resume driving. He is followed thru the pain clinic chronically. He has injections for his back every 3 months - this is for his chronic pain - he is asking about how he is to continue this. He is on multiple narcotics as well. No further chest pain reported since discharge. They are trying to follow more careful diet. No bleeding noted. No falls. No more chest pain. He is asking me about his life expectancy.   Past Medical History:  Diagnosis Date  . AR (allergic rhinitis)   . CAD S/P percutaneous coronary angioplasty    3 stents on '01, one stent in '09, known CTO FCX  . Chronic back pain   . Constipation, unspecified 06/09/2009  . CVA, old, alterations of sensations   . Degeneration of cervical intervertebral disc   . Degeneration of lumbar or lumbosacral intervertebral disc 06/10/2007  . Depression   . Diverticulitis of colon without hemorrhage   . Dyslipidemia   . Epidural abscess   .  GERD (gastroesophageal reflux disease)   . Hypertension   . RBBB with left anterior fascicular block   . Spinal stenosis    HAD SURGERY    Past Surgical History:  Procedure Laterality Date  . BACK SURGERY    . CORONARY/GRAFT ACUTE MI REVASCULARIZATION N/A 02/22/2020   Procedure: Coronary/Graft Acute MI Revascularization;  Surgeon: Burnell Blanks, MD;  Location: [place] [facility];  Service: Cardiovascular;  Laterality: N/A;  . LAMINECTOMY  2009   OF C6-C7  . LEFT HEART CATH AND CORONARY ANGIOGRAPHY N/A 02/22/2020   Procedure: LEFT HEART CATH AND CORONARY  ANGIOGRAPHY;  Surgeon: Burnell Blanks, MD;  Location: [place] [facility];  Service: Cardiovascular;  Laterality: N/A;  . LUMBAR [place]    . MOUTH SURGERY       Medications: Current Meds  Medication Sig  . Alum Hydroxide-Mag Carbonate (GAVISCON EXTRA STRENGTH PO) Take by mouth.  . Ascorbic Acid (VITAMIN C) 1000 MG tablet Take 1,000 mg by mouth daily.  Marland Kitchen aspirin 81 MG chewable tablet Chew 1 tablet (81 mg total) by mouth daily.  Marland Kitchen atenolol (TENORMIN) 50 MG tablet Take 1 tablet (50 mg total) by mouth at bedtime.  . buprenorphine (BUTRANS - DOSED MCG/HR) 20 MCG/HR PTWK patch Place 20 mcg onto the skin every 7 (seven) days.   . Ca Carbonate-Mag Hydroxide (ROLAIDS PO) Take 1 tablet by mouth daily as needed (Heart burn).   . Chlorpheniramine-Acetaminophen (CORICIDIN HBP COLD/FLU PO) Take by mouth.  . Cholecalciferol (VITAMIN D3) 10000 units TABS Take 1,000 Units by mouth daily.  . Coenzyme Q10 (COQ10) 200 MG CAPS Take 200 mg by mouth daily.  . cyclobenzaprine (FLEXERIL) 10 MG tablet Take 10 mg by mouth 3 (three) times daily as needed for muscle spasms.  Marland Kitchen dextromethorphan-guaiFENesin (MUCINEX DM) 30-600 MG 12hr tablet Take 2 tablets by mouth every 12 (twelve) hours as needed for cough.   . DULoxetine (CYMBALTA) 20 MG capsule Take 10 mg by mouth daily.  Marland Kitchen Fexofenadine HCl (ALLEGRA PO) Take 1 tablet by mouth daily as needed (for allergy).   . Flaxseed, Linseed, (FLAX SEED OIL) 1000 MG CAPS Take by mouth.  Marland Kitchen HYDROcodone-acetaminophen (NORCO) 10-325 MG tablet Take 1 tablet by mouth in the morning, at noon, and at bedtime.   . isosorbide mononitrate (IMDUR) 30 MG 24 hr tablet Take 1 tablet (30 mg total) by mouth daily.  . mometasone (NASONEX) 50 MCG/ACT nasal spray Place 2 sprays into the nose daily.  . Multiple Vitamin (MULTIVITAMIN WITH MINERALS) TABS tablet Take 1 tablet by mouth daily. Centrum Silver  . nitroGLYCERIN (NITROSTAT) 0.4 MG SL tablet Place 0.4 mg under the tongue every 5  (five) minutes as needed for chest pain.  . Omega-3 Fatty Acids ([employer] NATURAL FISH OIL) 1200 MG CAPS Take by mouth.  . Omega-3 Fatty Acids (FISH OIL PO) Take 1 capsule by mouth daily.  . polyethylene glycol powder (MIRALAX) powder Take 1 Container by mouth daily.   . rosuvastatin (CRESTOR) 40 MG tablet Take 1 tablet (40 mg total) by mouth daily.  . ticagrelor (BRILINTA) 90 MG TABS tablet Take 1 tablet (90 mg total) by mouth 2 (two) times daily.  Loura Pardon Salicylate (BLUE-EMU HEMP EX) Apply topically.     Allergies: Allergies  Allergen Reactions  . Codeine Nausea Only  . Lidoderm [Lidocaine]   . Paxil [Paroxetine Hcl]     Social History: The patient  reports that he has never smoked. He has never used smokeless tobacco.  Family History: The patient's family history is not on file.   Review of Systems: Please see the history of present illness.   All other systems are reviewed and negative.   Physical Exam: VS:  BP (!) 116/62   Ht 5\' 6"  (1.676 m)   Wt 151 lb 6.4 oz (68.7 kg)   SpO2 98%   BMI 24.44 kg/m  .  BMI Body mass index is 24.44 kg/m.  Wt Readings from Last 3 Encounters:  03/07/20 151 lb 6.4 oz (68.7 kg)  02/24/20 146 lb 1.6 oz (66.3 kg)  01/18/20 152 lb (68.9 kg)    General: Pleasant. Elderly. Seems frail to me but alert and in no acute distress.   Cardiac: Regular rate and rhythm. No murmurs, rubs, or gallops. No edema.  Respiratory:  Lungs are clear to auscultation bilaterally with normal work of breathing.  GI: Soft and nontender.  MS: No deformity or atrophy. Gait and ROM intact. He is using a walker.  Skin: Warm and dry. Color is normal. Some bruising.  Neuro:  Strength and sensation are intact and no gross focal deficits noted.  Psych: Alert, appropriate and with normal affect.   LABORATORY DATA:  EKG:  EKG is ordered today.  Personally reviewed by me. This demonstrates sinus - 1st degree AV block, RBBB - LVH.  Lab Results  Component Value Date    WBC 9.7 02/24/2020   HGB 11.0 (L) 02/24/2020   HCT 31.9 (L) 02/24/2020   PLT 225 02/24/2020   GLUCOSE 105 (H) 02/25/2020   CHOL 133 02/25/2020   TRIG 75 02/25/2020   HDL 51 02/25/2020   LDLCALC 67 02/25/2020   NA 129 (L) 02/25/2020   K 4.2 02/25/2020   CL 95 (L) 02/25/2020   CREATININE 0.78 02/25/2020   BUN 20 02/25/2020   CO2 23 02/25/2020   HGBA1C 5.2 02/25/2020     BNP (last 3 results) No results for input(s): BNP in the last 8760 hours.  ProBNP (last 3 results) No results for input(s): PROBNP in the last 8760 hours.   Other Studies Reviewed Today:  Left Heart Catheterization 02/22/2020:  Ost RCA to Prox RCA lesion is 60% stenosed.  Prox RCA to Mid RCA lesion is 100% stenosed.  Mid RCA to Dist RCA lesion is 50% stenosed.  Ost Cx to Prox Cx lesion is 100% stenosed.  Mid LAD-1 lesion is 30% stenosed.  Mid LAD-2 lesion is 100% stenosed.  Mid LAD to Dist LAD lesion is 50% stenosed.  Balloon angioplasty was performed using a BALLOON SAPPHIRE 2.5X12.  Post intervention, there is a 60% residual stenosis.  Post intervention, there is a 50% residual stenosis.  Ramus lesion is 90% stenosed.  1. The LAD courses to the apex. The most proximal stent in the mid LAD is patent but the vessel is occluded beyond this stent. The distal vessel is seen to fill from left to left bridging collaterals.  2. Large intermediate branch with severe mid stenosis.  3. Chronically occluded proximal Circumflex. The Circumflex fills from left to left collaterals on initial images and from right to left collaterals after the RCA was opened.  4. Thrombotic occlusion of the proximal to mid RCA. The RCA is a large dominant vessel with diffuse calcification of the proximal/mid/distal vessel. The distal RCA fills faintly from left to right collaterals.  5. Successful PTCA with balloon angioplasty only of the RCA. Flow restored. No stent placed.   Recommendations: Will continue ASA, statin  and beta blocker.  I will hold Plavix. Continue Aggrastat drip for now. I will ask CT surgery to see him tomorrow to review his images and decide if he is a candidate for bypass surgery. Given the chronic occlusion of the Circumflex and LAD, full revascularization could not be achieved with PCI/stenting of the RCA. Any further intervention of the RCA would require atherectomy prior to stent placement. Echo in am.   Diagnostic Dominance: Right  Intervention     _____________   Echocardiogram 02/23/2020: Impressions: 1. Left ventricular ejection fraction, by estimation, is 55 to 60%. The  left ventricle has normal function. The left ventricle has no regional  wall motion abnormalities. There is mild concentric left ventricular  hypertrophy. Left ventricular diastolic  parameters are indeterminate.  2. Right ventricular systolic function is normal. The right ventricular  size is normal.  3. The mitral valve is normal in structure. Trivial mitral valve  regurgitation.  4. The aortic valve is tricuspid. Aortic valve regurgitation is mild.  5. Aortic dilatation noted. There is moderate dilatation of the ascending  aorta measuring 44 mm.  6. The inferior vena cava is normal in size with greater than 50%  respiratory variability, suggesting right atrial pressure of 3 mmHg.   Comparison(s): No prior Echocardiogram.   Conclusion(s)/Recommendation(s): Normal biventricular function without  evidence of hemodynamically significant valvular heart disease.    Assessment/Plan:  1. Recent STEMI with emergent cath - he has had balloon PCI to the RCA - he has had arthrectomy/CTO PCI planned for later next month. He was not a candidate for CABG given poor targets. Will still have residual disease that will need to be managed medically even if PCI/CTO is successful - no further chest pain reported. Lab today - will need repeat lab closer to his procedure date. Noted that the  risk/benefits/procedures were discussed with patient by Dr. Martinique. Will continue with DAPT.    2. Dilated ascending aorta - measures 44 mm - could consider outpatient CTA - this was not discussed with them today - would favor discussion of this on return and after repeat lab obtained.   3. HTN - BP is fine here today. No changes made today.   4. HLD - on high dose Crestor - LDL is at goal.   5. Hyponatremia - will recheck lab today.   6. Bradycardia/bifascicular block - on lower dose of beta blocker therapy - no worrisome symptoms noted but this will need to be followed. HR is acceptable today at 63.   7. Chronic back pain - followed in the pain clinic - explained that injections will not be able to continue for most likely the next 6 months following PCI.   Current medicines are reviewed with the patient today.  The patient does not have concerns regarding medicines other than what has been noted above.  The following changes have been made:  See above.  Labs/ tests ordered today include:    Orders Placed This Encounter  Procedures  . Basic metabolic panel  . CBC  . Comprehensive metabolic panel  . CBC  . EKG 12-Lead     Disposition:   FU with Dr. Marlou Porch towards the end of August.    Patient is agreeable to this plan and will call if any problems develop in the interim.   SignedTruitt Merle, NP  03/07/2020 9:56 AM  [facility] [address] [place], [place]  [address] Phone: [phone] Fax: [phone]

## 2020-03-04 DIAGNOSIS — I2111 ST elevation (STEMI) myocardial infarction involving right coronary artery: Secondary | ICD-10-CM | POA: Diagnosis not present

## 2020-03-04 DIAGNOSIS — I69398 Other sequelae of cerebral infarction: Secondary | ICD-10-CM | POA: Diagnosis not present

## 2020-03-04 DIAGNOSIS — R001 Bradycardia, unspecified: Secondary | ICD-10-CM | POA: Diagnosis not present

## 2020-03-04 DIAGNOSIS — I119 Hypertensive heart disease without heart failure: Secondary | ICD-10-CM | POA: Diagnosis not present

## 2020-03-04 DIAGNOSIS — I444 Left anterior fascicular block: Secondary | ICD-10-CM | POA: Diagnosis not present

## 2020-03-04 DIAGNOSIS — Z48812 Encounter for surgical aftercare following surgery on the circulatory system: Secondary | ICD-10-CM | POA: Diagnosis not present

## 2020-03-04 DIAGNOSIS — I2510 Atherosclerotic heart disease of native coronary artery without angina pectoris: Secondary | ICD-10-CM | POA: Diagnosis not present

## 2020-03-04 DIAGNOSIS — I700 Atherosclerosis of aorta: Secondary | ICD-10-CM | POA: Diagnosis not present

## 2020-03-04 DIAGNOSIS — I4510 Unspecified right bundle-branch block: Secondary | ICD-10-CM | POA: Diagnosis not present

## 2020-03-07 ENCOUNTER — Ambulatory Visit (INDEPENDENT_AMBULATORY_CARE_PROVIDER_SITE_OTHER): Payer: Medicare PPO | Admitting: Nurse Practitioner

## 2020-03-07 ENCOUNTER — Encounter: Payer: Self-pay | Admitting: Nurse Practitioner

## 2020-03-07 ENCOUNTER — Other Ambulatory Visit: Payer: Self-pay

## 2020-03-07 VITALS — BP 116/62 | Ht 66.0 in | Wt 151.4 lb

## 2020-03-07 DIAGNOSIS — I10 Essential (primary) hypertension: Secondary | ICD-10-CM | POA: Diagnosis not present

## 2020-03-07 DIAGNOSIS — Z9861 Coronary angioplasty status: Secondary | ICD-10-CM | POA: Diagnosis not present

## 2020-03-07 DIAGNOSIS — E785 Hyperlipidemia, unspecified: Secondary | ICD-10-CM | POA: Diagnosis not present

## 2020-03-07 DIAGNOSIS — I2510 Atherosclerotic heart disease of native coronary artery without angina pectoris: Secondary | ICD-10-CM

## 2020-03-07 DIAGNOSIS — R079 Chest pain, unspecified: Secondary | ICD-10-CM

## 2020-03-07 DIAGNOSIS — I452 Bifascicular block: Secondary | ICD-10-CM | POA: Diagnosis not present

## 2020-03-07 LAB — CBC
Hematocrit: 36.5 % — ABNORMAL LOW (ref 37.5–51.0)
Hemoglobin: 12.5 g/dL — ABNORMAL LOW (ref 13.0–17.7)
MCH: 33.7 pg — ABNORMAL HIGH (ref 26.6–33.0)
MCHC: 34.2 g/dL (ref 31.5–35.7)
MCV: 98 fL — ABNORMAL HIGH (ref 79–97)
Platelets: 299 10*3/uL (ref 150–450)
RBC: 3.71 x10E6/uL — ABNORMAL LOW (ref 4.14–5.80)
RDW: 11.8 % (ref 11.6–15.4)
WBC: 7.1 10*3/uL (ref 3.4–10.8)

## 2020-03-07 LAB — BASIC METABOLIC PANEL
BUN/Creatinine Ratio: 16 (ref 10–24)
BUN: 16 mg/dL (ref 8–27)
CO2: 23 mmol/L (ref 20–29)
Calcium: 9.6 mg/dL (ref 8.6–10.2)
Chloride: 91 mmol/L — ABNORMAL LOW (ref 96–106)
Creatinine, Ser: 1.02 mg/dL (ref 0.76–1.27)
GFR calc Af Amer: 79 mL/min/{1.73_m2} (ref >59)
GFR calc non Af Amer: 69 mL/min/{1.73_m2} (ref >59)
Glucose: 97 mg/dL (ref 65–99)
Potassium: 4.3 mmol/L (ref 3.5–5.2)
Sodium: 129 mmol/L — ABNORMAL LOW (ref 134–144)

## 2020-03-07 NOTE — Patient Instructions (Addendum)
After Visit Summary:  We will be checking the following labs today - BMET and CBC today.  CMET and CBC on August 13th  Medication Instructions:    Continue with your current medicines.    If you need a refill on your cardiac medications before your next appointment, please call your pharmacy.     Testing/Procedures To Be Arranged:  Repeat cath as planned on August 18th with Dr. Martinique.   Follow-Up:   See Dr. Marlou Porch towards the end of August    At [facility], you and your health needs are our priority.  As part of our continuing mission to provide you with exceptional heart care, we have created designated Provider Care Teams.  These Care Teams include your primary Cardiologist (physician) and Advanced Practice Providers (APPs -  Physician Assistants and Nurse Practitioners) who all work together to provide you with the care you need, when you need it.  Special Instructions:  . Stay safe, wash your hands for at least 20 seconds and wear a mask when needed.  It was good to talk with you both today.    Your provider has recommended a cardiac catherization.   You are scheduled for a cardiac catheterization on Wednesday, August 18th at 8:30 AM with Dr. Martinique or associate.  Please arrive at the [facility] (Main Entrance) at [facility] at [place] Stay on Wednesday, August 18th at [place] parking is available.   You are allowed only one visitor in the hospital with you. Both you and your visitor must wear masks.    Special note: Every effort is made to have your procedure done on time.   Please understand that emergencies sometimes delay a scheduled   procedure.  No food or drink after midnight on Tuesday, August 17th.   On the morning of your procedure, make sure you take your aspirin and Brilinta.   You may take your morning medications with a sip of water on the day of your procedure.   Medications to [place] for a one night stay -- bring personal belongings.  Bring a current list of your medications and current insurance cards.  You MUST have a responsible person to drive you home. Someone MUST be with you the first 24 hours after you arrive home or your discharge will be delayed. Wear clothes that are easy to get on and off and wear slip on shoes.    Coronary Angiogram A coronary angiogram, also called coronary angiography, is an X-ray procedure used to look at the arteries in the heart. In this procedure, a dye (contrast dye) is injected through a long, hollow tube (catheter). The catheter is about the size of a piece of cooked spaghetti and is inserted through your groin, wrist, or arm. The dye is injected into each artery, and X-rays are then taken to show if there is a blockage in the arteries of your heart.  LET [facility] CARE PROVIDER KNOW ABOUT:  Any allergies you have, including allergies to shellfish or contrast dye.    All medicines you are taking, including vitamins, herbs, eye drops, creams, and over-the-counter medicines.    Previous problems you or members of your family have had with the use of anesthetics.    Any blood disorders you have.    Previous surgeries you have had.  History of kidney problems or failure.    Other medical conditions you  have.  RISKS AND COMPLICATIONS  Generally, a coronary angiogram is a safe procedure. However, about 1 person out of 1000 can have problems that may include:  Allergic reaction to the dye.  Bleeding/bruising from the access site or other locations.  Kidney injury, especially in people with impaired kidney function.   Stroke (rare).  Heart attack (rare).  Irregular rhythms (rare)  Death (rare)  BEFORE THE PROCEDURE   Do not eat or drink anything after midnight the night before the procedure or as directed by your health care provider.    Ask your health care provider about changing or stopping your  regular medicines. This is especially important if you are taking diabetes medicines or blood thinners.  PROCEDURE  You may be given a medicine to help you relax (sedative) before the procedure. This medicine is given through an intravenous (IV) access tube that is inserted into one of your veins.    The area where the catheter will be inserted will be washed and shaved. This is usually done in the groin but may be done in the fold of your arm (near your elbow) or in the wrist.     A medicine will be given to numb the area where the catheter will be inserted (local anesthetic).    The health care provider will insert the catheter into an artery. The catheter will be guided by using a special type of X-ray (fluoroscopy) of the blood vessel being examined.    A special dye will then be injected into the catheter, and X-rays will be taken. The dye will help to show where any narrowing or blockages are located in the heart arteries.     AFTER THE PROCEDURE   If the procedure is done through the leg, you will be kept in bed lying flat for several hours. You will be instructed to not bend or cross your legs.  The insertion site will be checked frequently.    The pulse in your feet or wrist will be checked frequently.    Additional blood tests, X-rays, and an electrocardiogram may be done.      Call the [place] office at [phone] if you have any questions, problems or concerns.

## 2020-03-09 DIAGNOSIS — I69398 Other sequelae of cerebral infarction: Secondary | ICD-10-CM | POA: Diagnosis not present

## 2020-03-09 DIAGNOSIS — I4510 Unspecified right bundle-branch block: Secondary | ICD-10-CM | POA: Diagnosis not present

## 2020-03-09 DIAGNOSIS — I119 Hypertensive heart disease without heart failure: Secondary | ICD-10-CM | POA: Diagnosis not present

## 2020-03-09 DIAGNOSIS — R001 Bradycardia, unspecified: Secondary | ICD-10-CM | POA: Diagnosis not present

## 2020-03-09 DIAGNOSIS — Z48812 Encounter for surgical aftercare following surgery on the circulatory system: Secondary | ICD-10-CM | POA: Diagnosis not present

## 2020-03-09 DIAGNOSIS — I2111 ST elevation (STEMI) myocardial infarction involving right coronary artery: Secondary | ICD-10-CM | POA: Diagnosis not present

## 2020-03-09 DIAGNOSIS — I700 Atherosclerosis of aorta: Secondary | ICD-10-CM | POA: Diagnosis not present

## 2020-03-09 DIAGNOSIS — I444 Left anterior fascicular block: Secondary | ICD-10-CM | POA: Diagnosis not present

## 2020-03-09 DIAGNOSIS — I2510 Atherosclerotic heart disease of native coronary artery without angina pectoris: Secondary | ICD-10-CM | POA: Diagnosis not present

## 2020-03-10 DIAGNOSIS — M533 Sacrococcygeal disorders, not elsewhere classified: Secondary | ICD-10-CM | POA: Diagnosis not present

## 2020-03-10 DIAGNOSIS — M961 Postlaminectomy syndrome, not elsewhere classified: Secondary | ICD-10-CM | POA: Diagnosis not present

## 2020-03-16 DIAGNOSIS — I2111 ST elevation (STEMI) myocardial infarction involving right coronary artery: Secondary | ICD-10-CM | POA: Diagnosis not present

## 2020-03-16 DIAGNOSIS — I700 Atherosclerosis of aorta: Secondary | ICD-10-CM | POA: Diagnosis not present

## 2020-03-16 DIAGNOSIS — I2510 Atherosclerotic heart disease of native coronary artery without angina pectoris: Secondary | ICD-10-CM | POA: Diagnosis not present

## 2020-03-16 DIAGNOSIS — Z48812 Encounter for surgical aftercare following surgery on the circulatory system: Secondary | ICD-10-CM | POA: Diagnosis not present

## 2020-03-16 DIAGNOSIS — I119 Hypertensive heart disease without heart failure: Secondary | ICD-10-CM | POA: Diagnosis not present

## 2020-03-16 DIAGNOSIS — R001 Bradycardia, unspecified: Secondary | ICD-10-CM | POA: Diagnosis not present

## 2020-03-16 DIAGNOSIS — I69398 Other sequelae of cerebral infarction: Secondary | ICD-10-CM | POA: Diagnosis not present

## 2020-03-16 DIAGNOSIS — I444 Left anterior fascicular block: Secondary | ICD-10-CM | POA: Diagnosis not present

## 2020-03-16 DIAGNOSIS — I4510 Unspecified right bundle-branch block: Secondary | ICD-10-CM | POA: Diagnosis not present

## 2020-03-23 DIAGNOSIS — R001 Bradycardia, unspecified: Secondary | ICD-10-CM | POA: Diagnosis not present

## 2020-03-23 DIAGNOSIS — I119 Hypertensive heart disease without heart failure: Secondary | ICD-10-CM | POA: Diagnosis not present

## 2020-03-23 DIAGNOSIS — Z48812 Encounter for surgical aftercare following surgery on the circulatory system: Secondary | ICD-10-CM | POA: Diagnosis not present

## 2020-03-23 DIAGNOSIS — I700 Atherosclerosis of aorta: Secondary | ICD-10-CM | POA: Diagnosis not present

## 2020-03-23 DIAGNOSIS — I69398 Other sequelae of cerebral infarction: Secondary | ICD-10-CM | POA: Diagnosis not present

## 2020-03-23 DIAGNOSIS — I2111 ST elevation (STEMI) myocardial infarction involving right coronary artery: Secondary | ICD-10-CM | POA: Diagnosis not present

## 2020-03-23 DIAGNOSIS — I2510 Atherosclerotic heart disease of native coronary artery without angina pectoris: Secondary | ICD-10-CM | POA: Diagnosis not present

## 2020-03-23 DIAGNOSIS — I444 Left anterior fascicular block: Secondary | ICD-10-CM | POA: Diagnosis not present

## 2020-03-23 DIAGNOSIS — I4510 Unspecified right bundle-branch block: Secondary | ICD-10-CM | POA: Diagnosis not present

## 2020-03-25 ENCOUNTER — Other Ambulatory Visit: Payer: Self-pay

## 2020-03-25 ENCOUNTER — Other Ambulatory Visit: Payer: Medicare PPO | Admitting: *Deleted

## 2020-03-25 DIAGNOSIS — I452 Bifascicular block: Secondary | ICD-10-CM | POA: Diagnosis not present

## 2020-03-25 DIAGNOSIS — Z9861 Coronary angioplasty status: Secondary | ICD-10-CM

## 2020-03-25 DIAGNOSIS — I2510 Atherosclerotic heart disease of native coronary artery without angina pectoris: Secondary | ICD-10-CM | POA: Diagnosis not present

## 2020-03-25 DIAGNOSIS — E785 Hyperlipidemia, unspecified: Secondary | ICD-10-CM

## 2020-03-25 LAB — CBC
Hematocrit: 34.9 % — ABNORMAL LOW (ref 37.5–51.0)
Hemoglobin: 12.2 g/dL — ABNORMAL LOW (ref 13.0–17.7)
MCH: 33.2 pg — ABNORMAL HIGH (ref 26.6–33.0)
MCHC: 35 g/dL (ref 31.5–35.7)
MCV: 95 fL (ref 79–97)
Platelets: 276 10*3/uL (ref 150–450)
RBC: 3.68 x10E6/uL — ABNORMAL LOW (ref 4.14–5.80)
RDW: 11.9 % (ref 11.6–15.4)
WBC: 6.3 10*3/uL (ref 3.4–10.8)

## 2020-03-25 LAB — COMPREHENSIVE METABOLIC PANEL
ALT: 20 IU/L (ref 0–44)
AST: 23 IU/L (ref 0–40)
Albumin/Globulin Ratio: 2.5 — ABNORMAL HIGH (ref 1.2–2.2)
Albumin: 4.7 g/dL — ABNORMAL HIGH (ref 3.6–4.6)
Alkaline Phosphatase: 98 IU/L (ref 48–121)
BUN/Creatinine Ratio: 13 (ref 10–24)
BUN: 13 mg/dL (ref 8–27)
Bilirubin Total: 0.5 mg/dL (ref 0.0–1.2)
CO2: 26 mmol/L (ref 20–29)
Calcium: 9.8 mg/dL (ref 8.6–10.2)
Chloride: 94 mmol/L — ABNORMAL LOW (ref 96–106)
Creatinine, Ser: 1.04 mg/dL (ref 0.76–1.27)
GFR calc Af Amer: 77 mL/min/{1.73_m2} (ref >59)
GFR calc non Af Amer: 67 mL/min/{1.73_m2} (ref >59)
Globulin, Total: 1.9 g/dL (ref 1.5–4.5)
Glucose: 99 mg/dL (ref 65–99)
Potassium: 3.8 mmol/L (ref 3.5–5.2)
Sodium: 133 mmol/L — ABNORMAL LOW (ref 134–144)
Total Protein: 6.6 g/dL (ref 6.0–8.5)

## 2020-03-28 ENCOUNTER — Telehealth: Payer: Self-pay | Admitting: Cardiology

## 2020-03-28 ENCOUNTER — Other Ambulatory Visit (HOSPITAL_COMMUNITY)
Admission: RE | Admit: 2020-03-28 | Discharge: 2020-03-28 | Disposition: A | Discharge status: Home / Self Care | Payer: Medicare PPO | Source: Ambulatory Visit | Attending: Cardiology | Admitting: Cardiology

## 2020-03-28 DIAGNOSIS — Z20822 Contact with and (suspected) exposure to covid-19: Secondary | ICD-10-CM | POA: Diagnosis not present

## 2020-03-28 DIAGNOSIS — Z01812 Encounter for preprocedural laboratory examination: Secondary | ICD-10-CM | POA: Diagnosis not present

## 2020-03-28 LAB — SARS CORONAVIRUS 2 (TAT 6-24 HRS): SARS Coronavirus 2: NEGATIVE

## 2020-03-28 NOTE — Telephone Encounter (Signed)
Patient's wife is requesting to discuss instructions for Covid-19 test scheduled for today, 03/28/20 at 12:35 PM. Please call.

## 2020-03-28 NOTE — Telephone Encounter (Signed)
Pts wife [name] (on [place]) is calling to confirm the address for the pts covid test location for today.  Went over the location address and time of his appt with the pts wife, as mentioned below.  Wife verbalized understanding and agrees with this plan.  Wife was gracious for all the assistance provided.   Due to recent COVID-19 restrictions implemented by our local and state authorities and in an effort to keep both patients and staff as safe as possible, our hospital system requires COVID-19 testing prior to certain scheduled hospital procedures.  Please go to [place]. [name], [name] [address] on 03/28/20 at 12:35 pm  .  This is a drive up testing site.  You will not need to exit your vehicle.  You will not be billed at the time of testing but may receive a bill later depending on your insurance.  The approximate cost of the test is $100.  You must agree to self-quarantine from the time of your testing until the procedure date on 04/06/2020.  This should included staying home with ONLY the people you live with.  Avoid take-out, grocery store shopping or leaving the house for any non-emergent reason.  Failure to have your COVID-19 test done on the date and time you have been scheduled will result in cancellation of your procedure.  Please call our office at [phone] if you have any questions.

## 2020-03-29 ENCOUNTER — Telehealth: Payer: Self-pay | Admitting: *Deleted

## 2020-03-29 NOTE — Telephone Encounter (Signed)
Pt contacted pre-CTO  scheduled at [facility] for: Wednesday March 30, 2020 8:30 AM Verified arrival time and place: [facility]) at: 6:30 AM   No solid food after midnight prior to cath, clear liquids until 5 AM day of procedure.   AM meds can be  taken pre-cath with sips of water including: ASA 81 mg Brilinta 90 mg   Confirmed patient has responsible adult to drive home post procedure and observe 24 hours after arriving home: yes  You are allowed ONE visitor in the waiting room during your procedure. Both you and your visitor must wear a mask once you enter the hospital.       COVID-19 Pre-Screening Questions:  . In the past 14 days have you had a new cough, shortness of breath, headache, congestion, fever (100 or greater) unexplained body aches, new sore throat, or sudden loss of taste or sense of smell? no . In the past 14  days have you been around anyone with known Covid 19? no . Have you been vaccinated for COVID-19? Yes, see immunization history  Reviewed procedure/mask/visitor instructions, COVID-19 questions reviewed with patient.

## 2020-03-30 ENCOUNTER — Encounter (HOSPITAL_COMMUNITY): Admission: RE | Disposition: E | Payer: Medicare PPO | Source: Home / Self Care | Attending: Cardiology

## 2020-03-30 ENCOUNTER — Ambulatory Visit (HOSPITAL_COMMUNITY): Payer: Medicare PPO

## 2020-03-30 ENCOUNTER — Ambulatory Visit (HOSPITAL_COMMUNITY): Payer: Medicare PPO | Admitting: Certified Registered Nurse Anesthetist

## 2020-03-30 ENCOUNTER — Ambulatory Visit (HOSPITAL_COMMUNITY): Admission: RE | Disposition: E | Payer: Self-pay | Source: Home / Self Care | Attending: Cardiology

## 2020-03-30 ENCOUNTER — Ambulatory Visit (HOSPITAL_COMMUNITY)
Admission: RE | Admit: 2020-03-30 | Discharge: 2020-04-13 | Disposition: E | Payer: Medicare PPO | Attending: Cardiology | Admitting: Cardiology

## 2020-03-30 ENCOUNTER — Other Ambulatory Visit: Payer: Self-pay

## 2020-03-30 ENCOUNTER — Ambulatory Visit (HOSPITAL_BASED_OUTPATIENT_CLINIC_OR_DEPARTMENT_OTHER): Payer: Medicare PPO

## 2020-03-30 DIAGNOSIS — M549 Dorsalgia, unspecified: Secondary | ICD-10-CM | POA: Diagnosis not present

## 2020-03-30 DIAGNOSIS — R079 Chest pain, unspecified: Secondary | ICD-10-CM

## 2020-03-30 DIAGNOSIS — I452 Bifascicular block: Secondary | ICD-10-CM | POA: Insufficient documentation

## 2020-03-30 DIAGNOSIS — I509 Heart failure, unspecified: Secondary | ICD-10-CM | POA: Insufficient documentation

## 2020-03-30 DIAGNOSIS — E785 Hyperlipidemia, unspecified: Secondary | ICD-10-CM | POA: Insufficient documentation

## 2020-03-30 DIAGNOSIS — K661 Hemoperitoneum: Secondary | ICD-10-CM | POA: Diagnosis not present

## 2020-03-30 DIAGNOSIS — J9600 Acute respiratory failure, unspecified whether with hypoxia or hypercapnia: Secondary | ICD-10-CM | POA: Diagnosis not present

## 2020-03-30 DIAGNOSIS — Z7902 Long term (current) use of antithrombotics/antiplatelets: Secondary | ICD-10-CM | POA: Insufficient documentation

## 2020-03-30 DIAGNOSIS — I2584 Coronary atherosclerosis due to calcified coronary lesion: Secondary | ICD-10-CM | POA: Insufficient documentation

## 2020-03-30 DIAGNOSIS — I10 Essential (primary) hypertension: Secondary | ICD-10-CM | POA: Diagnosis present

## 2020-03-30 DIAGNOSIS — I110 Hypertensive heart disease with heart failure: Secondary | ICD-10-CM | POA: Diagnosis not present

## 2020-03-30 DIAGNOSIS — E872 Acidosis, unspecified: Secondary | ICD-10-CM

## 2020-03-30 DIAGNOSIS — F329 Major depressive disorder, single episode, unspecified: Secondary | ICD-10-CM | POA: Insufficient documentation

## 2020-03-30 DIAGNOSIS — G8929 Other chronic pain: Secondary | ICD-10-CM | POA: Insufficient documentation

## 2020-03-30 DIAGNOSIS — I25119 Atherosclerotic heart disease of native coronary artery with unspecified angina pectoris: Secondary | ICD-10-CM | POA: Diagnosis not present

## 2020-03-30 DIAGNOSIS — Z885 Allergy status to narcotic agent status: Secondary | ICD-10-CM | POA: Insufficient documentation

## 2020-03-30 DIAGNOSIS — Z79899 Other long term (current) drug therapy: Secondary | ICD-10-CM | POA: Insufficient documentation

## 2020-03-30 DIAGNOSIS — I77810 Thoracic aortic ectasia: Secondary | ICD-10-CM | POA: Insufficient documentation

## 2020-03-30 DIAGNOSIS — I313 Pericardial effusion (noninflammatory): Secondary | ICD-10-CM | POA: Insufficient documentation

## 2020-03-30 DIAGNOSIS — Z7982 Long term (current) use of aspirin: Secondary | ICD-10-CM | POA: Diagnosis not present

## 2020-03-30 DIAGNOSIS — Z515 Encounter for palliative care: Secondary | ICD-10-CM | POA: Insufficient documentation

## 2020-03-30 DIAGNOSIS — Z8673 Personal history of transient ischemic attack (TIA), and cerebral infarction without residual deficits: Secondary | ICD-10-CM | POA: Insufficient documentation

## 2020-03-30 DIAGNOSIS — I314 Cardiac tamponade: Secondary | ICD-10-CM | POA: Insufficient documentation

## 2020-03-30 DIAGNOSIS — I252 Old myocardial infarction: Secondary | ICD-10-CM | POA: Insufficient documentation

## 2020-03-30 DIAGNOSIS — E871 Hypo-osmolality and hyponatremia: Secondary | ICD-10-CM | POA: Insufficient documentation

## 2020-03-30 DIAGNOSIS — I209 Angina pectoris, unspecified: Secondary | ICD-10-CM | POA: Diagnosis not present

## 2020-03-30 DIAGNOSIS — I2510 Atherosclerotic heart disease of native coronary artery without angina pectoris: Secondary | ICD-10-CM

## 2020-03-30 DIAGNOSIS — I2582 Chronic total occlusion of coronary artery: Secondary | ICD-10-CM | POA: Diagnosis not present

## 2020-03-30 DIAGNOSIS — Z955 Presence of coronary angioplasty implant and graft: Secondary | ICD-10-CM | POA: Diagnosis not present

## 2020-03-30 DIAGNOSIS — K219 Gastro-esophageal reflux disease without esophagitis: Secondary | ICD-10-CM | POA: Insufficient documentation

## 2020-03-30 DIAGNOSIS — J9601 Acute respiratory failure with hypoxia: Secondary | ICD-10-CM

## 2020-03-30 DIAGNOSIS — R571 Hypovolemic shock: Secondary | ICD-10-CM | POA: Diagnosis not present

## 2020-03-30 DIAGNOSIS — I700 Atherosclerosis of aorta: Secondary | ICD-10-CM | POA: Diagnosis not present

## 2020-03-30 DIAGNOSIS — Z9861 Coronary angioplasty status: Secondary | ICD-10-CM

## 2020-03-30 DIAGNOSIS — I213 ST elevation (STEMI) myocardial infarction of unspecified site: Secondary | ICD-10-CM | POA: Diagnosis not present

## 2020-03-30 DIAGNOSIS — R579 Shock, unspecified: Secondary | ICD-10-CM

## 2020-03-30 DIAGNOSIS — Z888 Allergy status to other drugs, medicaments and biological substances status: Secondary | ICD-10-CM | POA: Insufficient documentation

## 2020-03-30 HISTORY — PX: CORONARY CTO INTERVENTION: CATH118236

## 2020-03-30 HISTORY — PX: PERICARDIOCENTESIS: CATH118255

## 2020-03-30 HISTORY — PX: CORONARY ATHERECTOMY: CATH118238

## 2020-03-30 HISTORY — PX: CORONARY ANGIOGRAPHY: CATH118303

## 2020-03-30 HISTORY — PX: TEMPORARY PACEMAKER: CATH118268

## 2020-03-30 HISTORY — PX: CORONARY STENT INTERVENTION: CATH118234

## 2020-03-30 HISTORY — PX: INTRAVASCULAR ULTRASOUND/IVUS: CATH118244

## 2020-03-30 LAB — POCT I-STAT 7, (LYTES, BLD GAS, ICA,H+H)
Acid-base deficit: 19 mmol/L — ABNORMAL HIGH (ref 0.0–2.0)
Acid-base deficit: 16 mmol/L — ABNORMAL HIGH (ref 0.0–2.0)
Acid-base deficit: 14 mmol/L — ABNORMAL HIGH (ref 0.0–2.0)
Acid-base deficit: 19 mmol/L — ABNORMAL HIGH (ref 0.0–2.0)
Acid-base deficit: 19 mmol/L — ABNORMAL HIGH (ref 0.0–2.0)
Bicarbonate: 10.1 mmol/L — ABNORMAL LOW (ref 20.0–28.0)
Bicarbonate: 11.1 mmol/L — ABNORMAL LOW (ref 20.0–28.0)
Bicarbonate: 10.8 mmol/L — ABNORMAL LOW (ref 20.0–28.0)
Bicarbonate: 7 mmol/L — ABNORMAL LOW (ref 20.0–28.0)
Bicarbonate: 7.2 mmol/L — ABNORMAL LOW (ref 20.0–28.0)
Calcium, Ion: 0.82 mmol/L — CL (ref 1.15–1.40)
Calcium, Ion: 0.88 mmol/L — CL (ref 1.15–1.40)
Calcium, Ion: 1.57 mmol/L (ref 1.15–1.40)
Calcium, Ion: 1.08 mmol/L — ABNORMAL LOW (ref 1.15–1.40)
Calcium, Ion: 1.12 mmol/L — ABNORMAL LOW (ref 1.15–1.40)
HCT: 30 % — ABNORMAL LOW (ref 39.0–52.0)
HCT: 26 % — ABNORMAL LOW (ref 39.0–52.0)
HCT: 24 % — ABNORMAL LOW (ref 39.0–52.0)
HCT: 32 % — ABNORMAL LOW (ref 39.0–52.0)
HCT: 32 % — ABNORMAL LOW (ref 39.0–52.0)
Hemoglobin: 10.2 g/dL — ABNORMAL LOW (ref 13.0–17.0)
Hemoglobin: 8.8 g/dL — ABNORMAL LOW (ref 13.0–17.0)
Hemoglobin: 8.2 g/dL — ABNORMAL LOW (ref 13.0–17.0)
Hemoglobin: 10.9 g/dL — ABNORMAL LOW (ref 13.0–17.0)
Hemoglobin: 10.9 g/dL — ABNORMAL LOW (ref 13.0–17.0)
O2 Saturation: 100 %
O2 Saturation: 100 %
O2 Saturation: 99 %
O2 Saturation: 97 %
O2 Saturation: 97 %
Patient temperature: 97.1
Patient temperature: 97.1
Patient temperature: 36.4
Patient temperature: 36.5
Patient temperature: 36.5
Potassium: 4.4 mmol/L (ref 3.5–5.1)
Potassium: 4.1 mmol/L (ref 3.5–5.1)
Potassium: 4.8 mmol/L (ref 3.5–5.1)
Potassium: 6 mmol/L — ABNORMAL HIGH (ref 3.5–5.1)
Potassium: 6 mmol/L — ABNORMAL HIGH (ref 3.5–5.1)
Sodium: 137 mmol/L (ref 135–145)
Sodium: 137 mmol/L (ref 135–145)
Sodium: 136 mmol/L (ref 135–145)
Sodium: 136 mmol/L (ref 135–145)
Sodium: 136 mmol/L (ref 135–145)
TCO2: 11 mmol/L — ABNORMAL LOW (ref 22–32)
TCO2: 12 mmol/L — ABNORMAL LOW (ref 22–32)
TCO2: 11 mmol/L — ABNORMAL LOW (ref 22–32)
TCO2: 8 mmol/L — ABNORMAL LOW (ref 22–32)
TCO2: 8 mmol/L — ABNORMAL LOW (ref 22–32)
pCO2 arterial: 32.4 mmHg (ref 32.0–48.0)
pCO2 arterial: 27.2 mmHg — ABNORMAL LOW (ref 32.0–48.0)
pCO2 arterial: 21.1 mmHg — ABNORMAL LOW (ref 32.0–48.0)
pCO2 arterial: 17.8 mmHg — CL (ref 32.0–48.0)
pCO2 arterial: 18.1 mmHg — CL (ref 32.0–48.0)
pH, Arterial: 7.094 — CL (ref 7.350–7.450)
pH, Arterial: 7.215 — ABNORMAL LOW (ref 7.350–7.450)
pH, Arterial: 7.316 — ABNORMAL LOW (ref 7.350–7.450)
pH, Arterial: 7.197 — CL (ref 7.350–7.450)
pH, Arterial: 7.205 — ABNORMAL LOW (ref 7.350–7.450)
pO2, Arterial: 440 mmHg — ABNORMAL HIGH (ref 83.0–108.0)
pO2, Arterial: 257 mmHg — ABNORMAL HIGH (ref 83.0–108.0)
pO2, Arterial: 171 mmHg — ABNORMAL HIGH (ref 83.0–108.0)
pO2, Arterial: 111 mmHg — ABNORMAL HIGH (ref 83.0–108.0)
pO2, Arterial: 112 mmHg — ABNORMAL HIGH (ref 83.0–108.0)

## 2020-03-30 LAB — BASIC METABOLIC PANEL
Anion gap: 13 (ref 5–15)
BUN: 11 mg/dL (ref 8–23)
CO2: 15 mmol/L — ABNORMAL LOW (ref 22–32)
Calcium: 7.8 mg/dL — ABNORMAL LOW (ref 8.9–10.3)
Chloride: 102 mmol/L (ref 98–111)
Creatinine, Ser: 1.06 mg/dL (ref 0.61–1.24)
GFR calc Af Amer: >60 mL/min (ref >60)
GFR calc non Af Amer: >60 mL/min (ref >60)
Glucose, Bld: 243 mg/dL — ABNORMAL HIGH (ref 70–99)
Potassium: 3.9 mmol/L (ref 3.5–5.1)
Sodium: 130 mmol/L — ABNORMAL LOW (ref 135–145)

## 2020-03-30 LAB — PREPARE RBC (CROSSMATCH)

## 2020-03-30 LAB — ECHOCARDIOGRAM LIMITED
Height: 68 in
Height: 68 in
Weight: 2320 oz
Weight: 2320 oz

## 2020-03-30 LAB — POCT ACTIVATED CLOTTING TIME
Activated Clotting Time: 252 seconds
Activated Clotting Time: 285 seconds
Activated Clotting Time: 345 seconds
Activated Clotting Time: 268 seconds
Activated Clotting Time: 351 seconds
Activated Clotting Time: 312 seconds
Activated Clotting Time: 384 seconds
Activated Clotting Time: 224 seconds
Activated Clotting Time: 197 seconds
Activated Clotting Time: 158 seconds

## 2020-03-30 LAB — DIC (DISSEMINATED INTRAVASCULAR COAGULATION)PANEL
D-Dimer, Quant: 0.45 ug/mL-FEU (ref 0.00–0.50)
Fibrinogen: 119 mg/dL — ABNORMAL LOW (ref 210–475)
INR: 2.1 — ABNORMAL HIGH (ref 0.8–1.2)
Platelets: 123 10*3/uL — ABNORMAL LOW (ref 150–400)
Prothrombin Time: 22.7 seconds — ABNORMAL HIGH (ref 11.4–15.2)
Smear Review: NONE SEEN
aPTT: >200 seconds (ref 24–36)

## 2020-03-30 LAB — CBC WITH DIFFERENTIAL/PLATELET
Abs Immature Granulocytes: 0.09 10*3/uL — ABNORMAL HIGH (ref 0.00–0.07)
Basophils Absolute: 0 10*3/uL (ref 0.0–0.1)
Basophils Relative: 0 %
Eosinophils Absolute: 0 10*3/uL (ref 0.0–0.5)
Eosinophils Relative: 0 %
HCT: 37 % — ABNORMAL LOW (ref 39.0–52.0)
Hemoglobin: 11.9 g/dL — ABNORMAL LOW (ref 13.0–17.0)
Immature Granulocytes: 1 %
Lymphocytes Relative: 16 %
Lymphs Abs: 2.1 10*3/uL (ref 0.7–4.0)
MCH: 29.7 pg (ref 26.0–34.0)
MCHC: 32.2 g/dL (ref 30.0–36.0)
MCV: 92.3 fL (ref 80.0–100.0)
Monocytes Absolute: 1.1 10*3/uL — ABNORMAL HIGH (ref 0.1–1.0)
Monocytes Relative: 9 %
Neutro Abs: 9.8 10*3/uL — ABNORMAL HIGH (ref 1.7–7.7)
Neutrophils Relative %: 74 %
Platelets: 76 10*3/uL — ABNORMAL LOW (ref 150–400)
RBC: 4.01 MIL/uL — ABNORMAL LOW (ref 4.22–5.81)
RDW: 13.9 % (ref 11.5–15.5)
WBC: 13.1 10*3/uL — ABNORMAL HIGH (ref 4.0–10.5)
nRBC: 0 % (ref 0.0–0.2)

## 2020-03-30 LAB — LACTIC ACID, PLASMA: Lactic Acid, Venous: 9.7 mmol/L (ref 0.5–1.9)

## 2020-03-30 LAB — CBC
HCT: 24 % — ABNORMAL LOW (ref 39.0–52.0)
HCT: 30.4 % — ABNORMAL LOW (ref 39.0–52.0)
Hemoglobin: 8.3 g/dL — ABNORMAL LOW (ref 13.0–17.0)
Hemoglobin: 10 g/dL — ABNORMAL LOW (ref 13.0–17.0)
MCH: 33.2 pg (ref 26.0–34.0)
MCH: 31.2 pg (ref 26.0–34.0)
MCHC: 34.6 g/dL (ref 30.0–36.0)
MCHC: 32.9 g/dL (ref 30.0–36.0)
MCV: 96 fL (ref 80.0–100.0)
MCV: 94.7 fL (ref 80.0–100.0)
Platelets: 212 10*3/uL (ref 150–400)
Platelets: 67 10*3/uL — ABNORMAL LOW (ref 150–400)
RBC: 2.5 MIL/uL — ABNORMAL LOW (ref 4.22–5.81)
RBC: 3.21 MIL/uL — ABNORMAL LOW (ref 4.22–5.81)
RDW: 12.3 % (ref 11.5–15.5)
RDW: 13.4 % (ref 11.5–15.5)
WBC: 13.1 10*3/uL — ABNORMAL HIGH (ref 4.0–10.5)
WBC: 12.1 10*3/uL — ABNORMAL HIGH (ref 4.0–10.5)
nRBC: 0 % (ref 0.0–0.2)
nRBC: 0 % (ref 0.0–0.2)

## 2020-03-30 LAB — ECHOCARDIOGRAM COMPLETE
Height: 68 in
Weight: 2320 oz

## 2020-03-30 LAB — PROTIME-INR
INR: 1.8 — ABNORMAL HIGH (ref 0.8–1.2)
Prothrombin Time: 19.8 seconds — ABNORMAL HIGH (ref 11.4–15.2)

## 2020-03-30 LAB — MASSIVE TRANSFUSION PROTOCOL ORDER (BLOOD BANK NOTIFICATION)

## 2020-03-30 LAB — HEMOGLOBIN AND HEMATOCRIT, BLOOD
HCT: 22.4 % — ABNORMAL LOW (ref 39.0–52.0)
Hemoglobin: 7.2 g/dL — ABNORMAL LOW (ref 13.0–17.0)

## 2020-03-30 LAB — APTT
aPTT: >200 seconds (ref 24–36)
aPTT: 40 seconds — ABNORMAL HIGH (ref 24–36)

## 2020-03-30 LAB — ABO/RH: ABO/RH(D): O POS

## 2020-03-30 SURGERY — CORONARY CTO INTERVENTION
Anesthesia: LOCAL

## 2020-03-30 SURGERY — PERICARDIOCENTESIS
Anesthesia: LOCAL

## 2020-03-30 MED ORDER — LIDOCAINE HCL (PF) 1 % IJ SOLN
INTRAMUSCULAR | Status: DC | PRN
  Administered 2020-03-30: 5 mL

## 2020-03-30 MED ORDER — SODIUM CHLORIDE 0.9% IV SOLUTION: Freq: Once | INTRAVENOUS | Status: DC

## 2020-03-30 MED ORDER — CVS NATURAL FISH OIL 1000 MG PO CAPS: ORAL_CAPSULE | Freq: Every day | ORAL | Status: DC

## 2020-03-30 MED ORDER — FENTANYL CITRATE (PF) 100 MCG/2ML IJ SOLN
INTRAMUSCULAR | Status: DC | PRN
  Administered 2020-03-30: 50 ug via INTRAVENOUS

## 2020-03-30 MED ORDER — IOHEXOL 350 MG/ML SOLN
INTRAVENOUS | Status: DC | PRN
  Administered 2020-03-30: 20 mL

## 2020-03-30 MED ORDER — ASPIRIN 81 MG PO CHEW: 81.0000 mg | CHEWABLE_TABLET | ORAL | Status: DC

## 2020-03-30 MED ORDER — DOCUSATE SODIUM 100 MG PO CAPS: 100.0000 mg | ORAL_CAPSULE | Freq: Two times a day (BID) | ORAL | Status: DC | PRN

## 2020-03-30 MED ORDER — IOHEXOL 350 MG/ML SOLN
80.0000 mL | Freq: Once | INTRAVENOUS | Status: AC | PRN
  Administered 2020-03-30: 80 mL via INTRAVENOUS

## 2020-03-30 MED ORDER — PANTOPRAZOLE SODIUM 40 MG PO TBEC: 40.0000 mg | DELAYED_RELEASE_TABLET | Freq: Every day | ORAL | Status: DC

## 2020-03-30 MED ORDER — SODIUM CHLORIDE 0.9 % IV BOLUS
500.0000 mL | Freq: Once | INTRAVENOUS | Status: AC
  Administered 2020-03-30: 500 mL via INTRAVENOUS

## 2020-03-30 MED ORDER — HEPARIN (PORCINE) IN NACL 1000-0.9 UT/500ML-% IV SOLN
INTRAVENOUS | Status: DC | PRN
  Administered 2020-03-30: 500 mL

## 2020-03-30 MED ORDER — MIDAZOLAM HCL 2 MG/2ML IJ SOLN
1.0000 mg | INTRAMUSCULAR | Status: DC | PRN
  Administered 2020-03-31 (×2): 1 mg via INTRAVENOUS
  Administered 2020-03-31: 2 mg via INTRAVENOUS
  Filled 2020-03-30 (×3): qty 2

## 2020-03-30 MED ORDER — HEPARIN (PORCINE) IN NACL 1000-0.9 UT/500ML-% IV SOLN
INTRAVENOUS | Status: AC
  Filled 2020-03-30: qty 500

## 2020-03-30 MED ORDER — VERAPAMIL HCL 2.5 MG/ML IV SOLN
INTRAVENOUS | Status: AC
  Filled 2020-03-30: qty 4

## 2020-03-30 MED ORDER — OLOPATADINE HCL 0.6 % NA SOLN: 2.0000 | Freq: Two times a day (BID) | NASAL | Status: DC

## 2020-03-30 MED ORDER — VASOPRESSIN 20 UNITS/100 ML INFUSION FOR SHOCK
0.0000 [IU]/min | INTRAVENOUS | Status: DC
  Filled 2020-03-30: qty 100

## 2020-03-30 MED ORDER — LIDOCAINE HCL (PF) 1 % IJ SOLN
INTRAMUSCULAR | Status: DC | PRN
  Administered 2020-03-30: 10 mL
  Administered 2020-03-30: 15 mL

## 2020-03-30 MED ORDER — NOREPINEPHRINE 4 MG/250ML-% IV SOLN: 0.0000 ug/min | INTRAVENOUS | Status: DC

## 2020-03-30 MED ORDER — MIDAZOLAM HCL 2 MG/2ML IJ SOLN
1.0000 mg | INTRAMUSCULAR | Status: AC | PRN
  Administered 2020-03-30 (×2): 2 mg via INTRAVENOUS
  Filled 2020-03-30 (×3): qty 2

## 2020-03-30 MED ORDER — LIDOCAINE HCL (CARDIAC) PF 100 MG/5ML IV SOSY
PREFILLED_SYRINGE | INTRAVENOUS | Status: DC | PRN
  Administered 2020-03-30: 100 mg via INTRAVENOUS

## 2020-03-30 MED ORDER — POLYETHYLENE GLYCOL 3350 17 G PO PACK: 17.0000 g | PACK | Freq: Every day | ORAL | Status: DC | PRN

## 2020-03-30 MED ORDER — SODIUM BICARBONATE 8.4 % IV SOLN
INTRAVENOUS | Status: AC
  Administered 2020-03-30: 50 meq
  Filled 2020-03-30: qty 100

## 2020-03-30 MED ORDER — CYCLOBENZAPRINE HCL 10 MG PO TABS
10.0000 mg | ORAL_TABLET | Freq: Three times a day (TID) | ORAL | Status: DC | PRN
  Filled 2020-03-30: qty 1

## 2020-03-30 MED ORDER — ONDANSETRON HCL 4 MG/2ML IJ SOLN
INTRAMUSCULAR | Status: DC | PRN
  Administered 2020-03-30: 4 mg via INTRAVENOUS

## 2020-03-30 MED ORDER — POLYETHYLENE GLYCOL 3350 17 G PO PACK: 17.0000 g | PACK | Freq: Every day | ORAL | Status: DC

## 2020-03-30 MED ORDER — ASCORBIC ACID 500 MG PO TABS: 250.0000 mg | ORAL_TABLET | Freq: Every day | ORAL | Status: DC

## 2020-03-30 MED ORDER — HEPARIN (PORCINE) IN NACL 1000-0.9 UT/500ML-% IV SOLN
INTRAVENOUS | Status: AC
  Filled 2020-03-30: qty 1000

## 2020-03-30 MED ORDER — SODIUM CHLORIDE 0.9% FLUSH: 3.0000 mL | INTRAVENOUS | Status: DC | PRN

## 2020-03-30 MED ORDER — NOREPINEPHRINE BITARTRATE 1 MG/ML IV SOLN
0.0000 ug/min | INTRAVENOUS | Status: DC
  Administered 2020-03-30: 4 ug/min via INTRAVENOUS
  Filled 2020-03-30 (×2): qty 4

## 2020-03-30 MED ORDER — ATENOLOL 25 MG PO TABS: 50.0000 mg | ORAL_TABLET | Freq: Every day | ORAL | Status: DC

## 2020-03-30 MED ORDER — VASOPRESSIN 20 UNITS/100 ML INFUSION FOR SHOCK
0.0000 [IU]/min | INTRAVENOUS | Status: DC
  Administered 2020-03-30: 0.03 [IU]/min via INTRAVENOUS
  Filled 2020-03-30 (×2): qty 100

## 2020-03-30 MED ORDER — ASPIRIN 81 MG PO CHEW: 81.0000 mg | CHEWABLE_TABLET | Freq: Every day | ORAL | Status: DC

## 2020-03-30 MED ORDER — NOREPINEPHRINE 16 MG/250ML-% IV SOLN
0.0000 ug/min | INTRAVENOUS | Status: DC
  Administered 2020-03-30: 30 ug/min via INTRAVENOUS
  Administered 2020-03-31: 50 ug/min via INTRAVENOUS
  Filled 2020-03-30 (×3): qty 250

## 2020-03-30 MED ORDER — FENTANYL CITRATE (PF) 100 MCG/2ML IJ SOLN
INTRAMUSCULAR | Status: AC
  Filled 2020-03-30: qty 2

## 2020-03-30 MED ORDER — MIDAZOLAM HCL 2 MG/2ML IJ SOLN
INTRAMUSCULAR | Status: AC
  Filled 2020-03-30: qty 2

## 2020-03-30 MED ORDER — NITROGLYCERIN 1 MG/10 ML FOR IR/CATH LAB
INTRA_ARTERIAL | Status: AC
  Filled 2020-03-30: qty 10

## 2020-03-30 MED ORDER — ALUM HYDROXIDE-MAG CARBONATE 160-105 MG PO CHEW: 1.0000 | CHEWABLE_TABLET | Freq: Every day | ORAL | Status: DC | PRN

## 2020-03-30 MED ORDER — ETOMIDATE 2 MG/ML IV SOLN
INTRAVENOUS | Status: DC | PRN
  Administered 2020-03-30: 12 mg via INTRAVENOUS

## 2020-03-30 MED ORDER — VITAMIN D 25 MCG (1000 UNIT) PO TABS
1000.0000 [IU] | ORAL_TABLET | Freq: Every day | ORAL | Status: DC
  Filled 2020-03-30: qty 1

## 2020-03-30 MED ORDER — SODIUM CHLORIDE 0.9 % IV SOLN: 250.0000 mL | INTRAVENOUS | Status: DC | PRN

## 2020-03-30 MED ORDER — IOHEXOL 350 MG/ML SOLN
INTRAVENOUS | Status: AC
  Filled 2020-03-30: qty 1

## 2020-03-30 MED ORDER — NITROGLYCERIN IN D5W 200-5 MCG/ML-% IV SOLN
INTRAVENOUS | Status: AC
  Filled 2020-03-30: qty 250

## 2020-03-30 MED ORDER — SODIUM CHLORIDE 0.9 % WEIGHT BASED INFUSION: 1.0000 mL/kg/h | INTRAVENOUS | Status: DC

## 2020-03-30 MED ORDER — FENTANYL CITRATE (PF) 100 MCG/2ML IJ SOLN
25.0000 ug | INTRAMUSCULAR | Status: DC | PRN
  Filled 2020-03-30: qty 2

## 2020-03-30 MED ORDER — SODIUM CHLORIDE 0.9 % IV SOLN
2.0000 g | Freq: Once | INTRAVENOUS | Status: AC
  Administered 2020-03-30: 2 g via INTRAVENOUS
  Filled 2020-03-30: qty 20

## 2020-03-30 MED ORDER — MIDAZOLAM HCL 2 MG/2ML IJ SOLN
INTRAMUSCULAR | Status: AC
  Administered 2020-03-30: 1 mg via INTRAVENOUS
  Filled 2020-03-30: qty 2

## 2020-03-30 MED ORDER — SODIUM CHLORIDE 0.9 % IV BOLUS: 500.0000 mL | Freq: Once | INTRAVENOUS | Status: DC

## 2020-03-30 MED ORDER — ISOSORBIDE MONONITRATE ER 30 MG PO TB24: 30.0000 mg | ORAL_TABLET | Freq: Every day | ORAL | Status: DC

## 2020-03-30 MED ORDER — IOHEXOL 350 MG/ML SOLN
INTRAVENOUS | Status: DC | PRN
  Administered 2020-03-30: 160 mL via INTRA_ARTERIAL

## 2020-03-30 MED ORDER — LIDOCAINE HCL (PF) 1 % IJ SOLN
INTRAMUSCULAR | Status: AC
  Filled 2020-03-30: qty 30

## 2020-03-30 MED ORDER — SODIUM CHLORIDE 0.9 % IV SOLN
INTRAVENOUS | Status: AC | PRN
  Administered 2020-03-30: 10 mL/h via INTRAVENOUS

## 2020-03-30 MED ORDER — POLYETHYLENE GLYCOL 3350 17 GM/SCOOP PO POWD: 1.0000 | Freq: Every day | ORAL | Status: DC

## 2020-03-30 MED ORDER — HYDROCODONE-ACETAMINOPHEN 10-325 MG PO TABS: 1.0000 | ORAL_TABLET | Freq: Four times a day (QID) | ORAL | Status: DC

## 2020-03-30 MED ORDER — ADULT MULTIVITAMIN W/MINERALS CH: 1.0000 | ORAL_TABLET | Freq: Every day | ORAL | Status: DC

## 2020-03-30 MED ORDER — NITROGLYCERIN 0.4 MG SL SUBL: 0.4000 mg | SUBLINGUAL_TABLET | SUBLINGUAL | Status: DC | PRN

## 2020-03-30 MED ORDER — PROTAMINE SULFATE 10 MG/ML IV SOLN: 25.0000 mg | Freq: Once | INTRAVENOUS | Status: DC

## 2020-03-30 MED ORDER — VERAPAMIL HCL 2.5 MG/ML IV SOLN
INTRAVENOUS | Status: AC
  Filled 2020-03-30: qty 2

## 2020-03-30 MED ORDER — SODIUM BICARBONATE 8.4 % IV SOLN: 25.0000 meq | Freq: Once | INTRAVENOUS | Status: AC

## 2020-03-30 MED ORDER — MIDAZOLAM HCL 2 MG/2ML IJ SOLN
INTRAMUSCULAR | Status: DC | PRN
  Administered 2020-03-30 (×3): 1 mg via INTRAVENOUS
  Administered 2020-03-30: 2 mg via INTRAVENOUS
  Administered 2020-03-30: 1 mg via INTRAVENOUS

## 2020-03-30 MED ORDER — SODIUM CHLORIDE 0.9% FLUSH: 3.0000 mL | Freq: Two times a day (BID) | INTRAVENOUS | Status: DC

## 2020-03-30 MED ORDER — ACETAMINOPHEN 325 MG PO TABS: 650.0000 mg | ORAL_TABLET | ORAL | Status: DC | PRN

## 2020-03-30 MED ORDER — DULOXETINE HCL 20 MG PO CPEP
20.0000 mg | ORAL_CAPSULE | Freq: Every day | ORAL | Status: DC
  Filled 2020-03-30: qty 1

## 2020-03-30 MED ORDER — ONDANSETRON HCL 4 MG/2ML IJ SOLN: 4.0000 mg | Freq: Four times a day (QID) | INTRAMUSCULAR | Status: DC | PRN

## 2020-03-30 MED ORDER — HEPARIN SODIUM (PORCINE) 1000 UNIT/ML IJ SOLN
INTRAMUSCULAR | Status: AC
  Filled 2020-03-30: qty 1

## 2020-03-30 MED ORDER — NITROGLYCERIN 1 MG/10 ML FOR IR/CATH LAB
INTRA_ARTERIAL | Status: DC | PRN
  Administered 2020-03-30 (×2): 200 ug via INTRACORONARY

## 2020-03-30 MED ORDER — ATROPINE SULFATE 1 MG/10ML IJ SOSY
PREFILLED_SYRINGE | INTRAMUSCULAR | Status: AC
  Filled 2020-03-30: qty 10

## 2020-03-30 MED ORDER — HEPARIN (PORCINE) IN NACL 1000-0.9 UT/500ML-% IV SOLN
INTRAVENOUS | Status: DC | PRN
  Administered 2020-03-30 (×2): 500 mL

## 2020-03-30 MED ORDER — COQ10 200 MG PO CAPS: 200.0000 mg | ORAL_CAPSULE | Freq: Every day | ORAL | Status: DC

## 2020-03-30 MED ORDER — SODIUM BICARBONATE 8.4 % IV SOLN
INTRAVENOUS | Status: AC
  Administered 2020-03-30: 50 meq
  Filled 2020-03-30: qty 50

## 2020-03-30 MED ORDER — PROTAMINE SULFATE 10 MG/ML IV SOLN: 50.0000 mg | Freq: Once | INTRAVENOUS | Status: DC

## 2020-03-30 MED ORDER — MIDAZOLAM HCL 2 MG/2ML IJ SOLN
INTRAMUSCULAR | Status: DC | PRN
  Administered 2020-03-30: 1 mg via INTRAVENOUS

## 2020-03-30 MED ORDER — FENTANYL CITRATE (PF) 100 MCG/2ML IJ SOLN
INTRAMUSCULAR | Status: AC
  Administered 2020-03-30: 100 ug via INTRAVENOUS
  Filled 2020-03-30: qty 2

## 2020-03-30 MED ORDER — FLUTICASONE PROPIONATE 50 MCG/ACT NA SUSP
1.0000 | Freq: Every day | NASAL | Status: DC
  Filled 2020-03-30: qty 16

## 2020-03-30 MED ORDER — SODIUM CHLORIDE 0.9 % IV SOLN: INTRAVENOUS | Status: DC

## 2020-03-30 MED ORDER — EPINEPHRINE HCL 5 MG/250ML IV SOLN IN NS
0.5000 ug/min | INTRAVENOUS | Status: DC
  Administered 2020-03-30 (×2): 1 ug/min via INTRAVENOUS
  Administered 2020-03-31: 9 ug/min via INTRAVENOUS
  Filled 2020-03-30: qty 250

## 2020-03-30 MED ORDER — VASOPRESSIN 20 UNIT/ML IV SOLN: 0.0000 [IU]/min | INTRAVENOUS | Status: DC

## 2020-03-30 MED ORDER — SODIUM CHLORIDE 0.9 % IV SOLN
INTRAVENOUS | Status: AC | PRN
  Administered 2020-03-30: 250 mL via INTRAVENOUS
  Administered 2020-03-30: 500 mL
  Administered 2020-03-30: 10 mL/h

## 2020-03-30 MED ORDER — AZELASTINE HCL 0.1 % NA SOLN
2.0000 | Freq: Two times a day (BID) | NASAL | Status: DC
  Filled 2020-03-30: qty 30

## 2020-03-30 MED ORDER — DOPAMINE-DEXTROSE 3.2-5 MG/ML-% IV SOLN
INTRAVENOUS | Status: AC
  Filled 2020-03-30: qty 250

## 2020-03-30 MED ORDER — DOCUSATE SODIUM 50 MG/5ML PO LIQD: 100.0000 mg | Freq: Two times a day (BID) | ORAL | Status: DC

## 2020-03-30 MED ORDER — LOSARTAN POTASSIUM 25 MG PO TABS: 25.0000 mg | ORAL_TABLET | Freq: Every day | ORAL | Status: DC

## 2020-03-30 MED ORDER — ROSUVASTATIN CALCIUM 20 MG PO TABS: 40.0000 mg | ORAL_TABLET | Freq: Every day | ORAL | Status: DC

## 2020-03-30 MED ORDER — PANTOPRAZOLE SODIUM 40 MG IV SOLR: 40.0000 mg | Freq: Every day | INTRAVENOUS | Status: DC

## 2020-03-30 MED ORDER — TICAGRELOR 90 MG PO TABS: 90.0000 mg | ORAL_TABLET | Freq: Two times a day (BID) | ORAL | Status: DC

## 2020-03-30 MED ORDER — NOREPINEPHRINE 4 MG/250ML-% IV SOLN
INTRAVENOUS | Status: AC
  Filled 2020-03-30: qty 250

## 2020-03-30 MED ORDER — SUCCINYLCHOLINE CHLORIDE 20 MG/ML IJ SOLN
INTRAMUSCULAR | Status: DC | PRN
  Administered 2020-03-30: 100 mg via INTRAVENOUS

## 2020-03-30 MED ORDER — SODIUM BICARBONATE 8.4 % IV SOLN
50.0000 meq | Freq: Once | INTRAVENOUS | Status: AC
  Administered 2020-03-30: 50 meq via INTRAVENOUS

## 2020-03-30 MED ORDER — LACTATED RINGERS IV BOLUS: 2000.0000 mL | Freq: Once | INTRAVENOUS | Status: DC

## 2020-03-30 MED ORDER — SODIUM BICARBONATE-DEXTROSE 150-5 MEQ/L-% IV SOLN
150.0000 meq | INTRAVENOUS | Status: AC
  Administered 2020-03-31 (×2): 150 meq via INTRAVENOUS
  Filled 2020-03-30 (×4): qty 1000

## 2020-03-30 MED ORDER — FENTANYL CITRATE (PF) 100 MCG/2ML IJ SOLN
25.0000 ug | INTRAMUSCULAR | Status: DC | PRN
  Administered 2020-03-30: 100 ug via INTRAVENOUS
  Filled 2020-03-30 (×2): qty 2

## 2020-03-30 MED ORDER — HEPARIN SODIUM (PORCINE) 1000 UNIT/ML IJ SOLN
INTRAMUSCULAR | Status: DC | PRN
  Administered 2020-03-30: 7000 [IU] via INTRAVENOUS
  Administered 2020-03-30 (×2): 2000 [IU] via INTRAVENOUS
  Administered 2020-03-30 (×2): 3000 [IU] via INTRAVENOUS

## 2020-03-30 MED ORDER — FENTANYL CITRATE (PF) 100 MCG/2ML IJ SOLN
INTRAMUSCULAR | Status: DC | PRN
  Administered 2020-03-30 (×3): 25 ug via INTRAVENOUS
  Administered 2020-03-30: 50 ug via INTRAVENOUS
  Administered 2020-03-30: 25 ug via INTRAVENOUS

## 2020-03-30 MED ORDER — ONDANSETRON HCL 4 MG/2ML IJ SOLN
INTRAMUSCULAR | Status: AC
  Filled 2020-03-30: qty 2

## 2020-03-30 MED ORDER — SODIUM BICARBONATE-DEXTROSE 150-5 MEQ/L-% IV SOLN
150.0000 meq | INTRAVENOUS | Status: DC
  Filled 2020-03-30: qty 1000

## 2020-03-30 SURGICAL SUPPLY — 40 items
BALLN EMERGE MR 3.0X20 (BALLOONS) ×2
BALLN MINITREK OTW 2.0X20 (BALLOONS) ×2
BALLN ~~LOC~~ EMERGE MR 4.5X15 (BALLOONS) ×2
BALLOON EMERGE MR 3.0X20 (BALLOONS) ×1 IMPLANT
BALLOON MINITREK OTW 2.0X20 (BALLOONS) ×1 IMPLANT
BALLOON ~~LOC~~ EMERGE MR 4.5X15 (BALLOONS) ×1 IMPLANT
BUR ROTAPRO CNCT ADVNCR 1.75 (BURR) ×1 IMPLANT
BURR ROTALINK 1.50MM (BURR) ×2 IMPLANT
BURR ROTAPRO CNCT ADVNCR 1.75 (BURR) ×2
CATH INFINITI JR4 5F (CATHETERS) ×2 IMPLANT
CATH LAUNCHER 6FR AL.75 (CATHETERS) ×2 IMPLANT
CATH MACH1 8F CLS4 (CATHETERS) ×2 IMPLANT
CATH MAMBA 135 (CATHETERS) ×2 IMPLANT
CATH OPTICROSS HD (CATHETERS) ×2 IMPLANT
CATH S G BIP PACING (CATHETERS) ×2 IMPLANT
CATH VISTA GUIDE 6FR XBLAD3.5 (CATHETERS) ×2 IMPLANT
GUIDEWIRE JUDO 3 190 (WIRE) ×2 IMPLANT
KIT ENCORE 26 ADVANTAGE (KITS) ×2 IMPLANT
KIT HEART LEFT (KITS) ×2 IMPLANT
KIT HEMO VALVE WATCHDOG (MISCELLANEOUS) ×2 IMPLANT
LUBRICANT ROTAGLIDE 20CC VIAL (MISCELLANEOUS) ×2 IMPLANT
PACK CARDIAC CATHETERIZATION (CUSTOM PROCEDURE TRAY) ×2 IMPLANT
SHEATH BRITE TIP 8FR 35CM (SHEATH) ×2 IMPLANT
SHEATH PINNACLE 5F 10CM (SHEATH) ×2 IMPLANT
SHEATH PINNACLE 6F 10CM (SHEATH) ×2 IMPLANT
SHEATH PROBE COVER 6X72 (BAG) ×2 IMPLANT
SLED PULL BACK IVUS (MISCELLANEOUS) ×2 IMPLANT
SLEEVE REPOSITIONING LENGTH 30 (MISCELLANEOUS) ×2 IMPLANT
STENT SYNERGY XD 3.50X48 (Permanent Stent) ×1 IMPLANT
STENT SYNERGY XD 4.0X32 (Permanent Stent) ×1 IMPLANT
SYNERGY XD 3.50X48 (Permanent Stent) ×2 IMPLANT
SYNERGY XD 4.0X32 (Permanent Stent) ×2 IMPLANT
TRANSDUCER W/STOPCOCK (MISCELLANEOUS) ×4 IMPLANT
TUBING CIL FLEX 10 FLL-RA (TUBING) ×2 IMPLANT
WIRE ASAHI PROWATER 180CM (WIRE) ×2 IMPLANT
WIRE ASAHI PROWATER 300CM (WIRE) ×2 IMPLANT
WIRE EMERALD 3MM-J .035X150CM (WIRE) ×2 IMPLANT
WIRE FIGHTER CROSSING 190CM (WIRE) ×2 IMPLANT
WIRE HI TORQ PILOT-200 300CM (WIRE) ×2 IMPLANT
WIRE ROTA FLOPPY .009X325CM (WIRE) ×2 IMPLANT

## 2020-03-30 SURGICAL SUPPLY — 11 items
CATH LAUNCHER 6FR EBU 4 (CATHETERS) ×2 IMPLANT
ELECT DEFIB PAD ADLT CADENCE (PAD) ×2 IMPLANT
KIT ESSENTIALS PG (KITS) ×2 IMPLANT
KIT HEART LEFT (KITS) ×2 IMPLANT
PACK CARDIAC CATHETERIZATION (CUSTOM PROCEDURE TRAY) ×2 IMPLANT
PERIVAC PERICARDIOCENTESIS 8.3 (TRAY / TRAY PROCEDURE) ×2 IMPLANT
SHEATH PINNACLE 8F 10CM (SHEATH) ×2 IMPLANT
SHEATH PINNACLE 9F 10CM (SHEATH) ×2 IMPLANT
SHEATH PROBE COVER 6X72 (BAG) ×2 IMPLANT
TRANSDUCER W/STOPCOCK (MISCELLANEOUS) ×2 IMPLANT
WIRE EMERALD 3MM-J .035X150CM (WIRE) ×2 IMPLANT

## 2020-03-30 NOTE — Progress Notes (Addendum)
Rt groin level 0; subxyphoid tegaderm dressing w/more oozing  w/pericardial drainage tube to [facility]

## 2020-03-30 NOTE — Progress Notes (Signed)
Patient reevaluated in the holding area.  Initially post pericardiocentesis patient's BP improved but has since declined again. Now 60/40.  In NSR rate 80s. Looks pale.  Still having chest pain. Prior Ecg showed no acute ischemic changes. Lungs are clear. CV without murmur or rub. Abdomen is soft and NT.   Pericardial drain in place with minimal drainage with aspiration.  Right groin looks good without hematoma. Catheters in place in right femoral artery and vein.  Repeat Echo done showing complete resolution of pericardial effusion.  Etiology of persistent hypotension at this point is unclear.  Will start Levophed for support. Given additional IV fluid.   Check stat CBC and BMET. Will check stat CT of chest/adbomen/pelvis to evaluate for other potential source of bleeding.   Kashena Novitski Martinique MD, [facility]  03/13/2020 3:37 PM

## 2020-03-30 NOTE — Progress Notes (Signed)
Patient's BP improved on IV Levophed  Hgb shows decrease from 12.4 to 8.4.   CT chest/abd/pelvis shows a hemoperitoneum predominantly in the RUQ around the liver.   This likely is related to hepatic laceration from needle with pericardiocentesis. There is no direct extravasation of blood around the drain.   After return from CT patient developed more difficulty with breathing and was intubated by anesthesia.   Discussed with CCM and General surgery who will evaluate the patient.  Will support acutely with pressors and IV PRBCs.   I discussed all these events fully with his wife.   Christopher Mcgowen Martinique MD, [facility]  04/04/2020 5:16 PM

## 2020-03-30 NOTE — Progress Notes (Addendum)
Placed on 10L face mask. O2 sat low 80s on 4L [place]. STAT blood drawn. Levophed drip started. Down to CT scan. Remains coherent. Skin pale and dry.

## 2020-03-30 NOTE — Progress Notes (Signed)
Anesthesia here to intubate. Dr. Martinique spoke w/wife on phone to update.

## 2020-03-30 NOTE — Progress Notes (Signed)
  Echocardiogram 2D Echocardiogram has been performed.  Christopher Ferguson 03/28/2020, 2:27 PM

## 2020-03-30 NOTE — Progress Notes (Signed)
Dr. Martinique spoke w/wife on phone. Chest pain 7/10

## 2020-03-30 NOTE — Progress Notes (Signed)
220cc bloody drainage emptied from DAVAL pericardial drain, Dr. Burt Knack informed. Oozing underneath tegaderm.

## 2020-03-30 NOTE — Anesthesia Procedure Notes (Signed)
Procedure Name: Intubation Date/Time: 03/14/2020 4:45 PM Performed by: Inda Coke, CRNA Pre-anesthesia Checklist: Patient identified, Emergency Drugs available, Suction available and Patient being monitored Patient Re-evaluated:Patient Re-evaluated prior to induction Oxygen Delivery Method: Circle System Utilized Preoxygenation: Pre-oxygenation with 100% oxygen Induction Type: IV induction, Rapid sequence and Cricoid Pressure applied Laryngoscope Size: Mac and 4 Grade View: Grade II Tube type: Oral Tube size: 7.5 mm Number of attempts: 1 Airway Equipment and Method: Stylet and Oral airway Placement Confirmation: ETT inserted through vocal cords under direct vision,  positive ETCO2 and breath sounds checked- equal and bilateral Secured at: 23 cm Tube secured with: Tape Dental Injury: Teeth and Oropharynx as per pre-operative assessment

## 2020-03-30 NOTE — Progress Notes (Addendum)
Pericardial dressing saturated w/blood under tegaderm. Resutured by Orest Dikes.  Dressing changed w/ tegaderm and new patch. Respirations regular, shallow. O2 sat 100% on 10L facemask

## 2020-03-30 NOTE — Progress Notes (Addendum)
Finished at 2100 1 unit platelets 2 units RBC   Finished bag of calcium gluconate gtt  Bp continued to drop - on norepi and vaso Epi drip started 500 cc NS bolus 190 cc out of pericardial drain   2200 CBC Hemoglobin 11.9 Blood gas showed pH 7.1, bicarb 7 mmol/L   2300 1 amp bicarb 1 liter NS bolus 1 Cryo unit 45 cc pericardial drain Bicarb drip started   0100 Fentanyl drip started Bicarb amp Blood gas Vasopressin dose increase to 0.04 per CCM MD Ellison  0300 Bicarb amp x2 500 cc NS bolus  0400 Bicab amp x2 500 cc NS bolus  0500 Pt woke up agitated, dyssynchronous with vent; able to follow commands (opens eyes, squeezes hand) 2mg  of versed given for sedation

## 2020-03-30 NOTE — Interval H&P Note (Signed)
History and Physical Interval Note:  03/14/2020 7:33 AM  Christopher Ferguson  has presented today for surgery, with the diagnosis of post stemi.  The various methods of treatment have been discussed with the patient and family. After consideration of risks, benefits and other options for treatment, the patient has consented to  Procedure(s): CORONARY CTO INTERVENTION (N/A) as a surgical intervention.  The patient's history has been reviewed, patient examined, no change in status, stable for surgery.  I have reviewed the patient's chart and labs.  Questions were answered to the patient's satisfaction.   Cath Lab Visit (complete for each Cath Lab visit)  Clinical Evaluation Leading to the Procedure:   ACS: No.  Non-ACS:    Anginal Classification: CCS III  Anti-ischemic medical therapy: Maximal Therapy (2 or more classes of medications)  Non-Invasive Test Results: No non-invasive testing performed  Prior CABG: No previous CABG        Collier Salina [facility] 03/29/2020 7:33 AM

## 2020-03-30 NOTE — Progress Notes (Addendum)
C/O 6/10 chest pain, back pain. Pale, clammy. Hypotensive. Dr. Martinique here. Head down. 500cc fluid bolus going. Bedside echo being done. Small, soft nonformed BM in bedpan. Rt groin level 0. Dressing changed. 0.9NS at 10cc/hr to rt fv sheath

## 2020-03-30 NOTE — Progress Notes (Signed)
Placed on [place]. To lab 3

## 2020-03-30 NOTE — Progress Notes (Signed)
RT NOTES: Critical ABG results given to Dr Silas Flood. FIO2 decreased to 40%.

## 2020-03-30 NOTE — Consult Note (Signed)
Surgical Evaluation Requesting provider: Dr. Martinique  Chief Complaint: hemoperitoneum  HPI: Taken from chart review and discussion with care team as patient is intubated and unable to participate.  84yo man who underwent PCI earlier today which was complicated by perforation/pericardial tamponade requiring pericardial drain placement with removal of 450cc initially. While his hemodynamics initially improved, he was noted to have worsening hypotension and hypoxia about an hour later. Labs were notable for a decrease in hgb from 12.2 to 8.3. CTA C/A/P was then completed demonstrating hemoperitoneum mostly around the right lobe of the liver, without active extravasation. Due to worsening hemodynamic and respiratory status, he has been intubated and emergent release blood products initiated.  Allergies  Allergen Reactions  . Codeine Nausea Only  . Lidoderm [Lidocaine] Other (See Comments)    unknown  . Paxil [Paroxetine Hcl] Other (See Comments)    unknown    Past Medical History:  Diagnosis Date  . AR (allergic rhinitis)   . CAD S/P percutaneous coronary angioplasty    3 stents on '01, one stent in '09, known CTO FCX  . Chronic back pain   . Constipation, unspecified 06/09/2009  . CVA, old, alterations of sensations   . Degeneration of cervical intervertebral disc   . Degeneration of lumbar or lumbosacral intervertebral disc 06/10/2007  . Depression   . Diverticulitis of colon without hemorrhage   . Dyslipidemia   . Epidural abscess   . GERD (gastroesophageal reflux disease)   . Hypertension   . RBBB with left anterior fascicular block   . Spinal stenosis    HAD SURGERY    Past Surgical History:  Procedure Laterality Date  . BACK SURGERY    . CORONARY/GRAFT ACUTE MI REVASCULARIZATION N/A 02/22/2020   Procedure: Coronary/Graft Acute MI Revascularization;  Surgeon: Burnell Blanks, MD;  Location: [facility];  Service: Cardiovascular;  Laterality: N/A;  . LAMINECTOMY   2009   OF C6-C7  . LEFT HEART CATH AND CORONARY ANGIOGRAPHY N/A 02/22/2020   Procedure: LEFT HEART CATH AND CORONARY ANGIOGRAPHY;  Surgeon: Burnell Blanks, MD;  Location: [facility];  Service: Cardiovascular;  Laterality: N/A;  . LUMBAR [place]    . MOUTH SURGERY      No family history on file.  Social History   Socioeconomic History  . Marital status: Married    Spouse name: Not on file  . Number of children: Not on file  . Years of education: Not on file  . Highest education level: Not on file  Occupational History  . Not on file  Tobacco Use  . Smoking status: Never Smoker  . Smokeless tobacco: Never Used  Substance and Sexual Activity  . Alcohol use: Not on file  . Drug use: Not on file  . Sexual activity: Not on file  Other Topics Concern  . Not on file  Social History Narrative  . Not on file   Social Determinants of Health   Financial Resource Strain:   . Difficulty of Paying Living Expenses:   Food Insecurity:   . Worried About Charity fundraiser in the Last Year:   . Arboriculturist in the Last Year:   Transportation Needs:   . Film/video editor (Medical):   Marland Kitchen Lack of Transportation (Non-Medical):   Physical Activity:   . Days of Exercise per Week:   . Minutes of Exercise per Session:   Stress:   . Feeling of Stress :   Social Connections:   .  Frequency of Communication with Friends and Family:   . Frequency of Social Gatherings with Friends and Family:   . Attends Religious Services:   . Active Member of Clubs or Organizations:   . Attends Archivist Meetings:   Marland Kitchen Marital Status:     No current facility-administered medications on file prior to encounter.   Current Outpatient Medications on File Prior to Encounter  Medication Sig Dispense Refill  . Alum Hydroxide-Mag Carbonate (GAVISCON EXTRA STRENGTH) 160-105 MG CHEW Chew 1 tablet by mouth daily as needed (Indigestion).     Marland Kitchen aspirin 81 MG chewable tablet  Chew 1 tablet (81 mg total) by mouth daily.    Marland Kitchen atenolol (TENORMIN) 50 MG tablet Take 1 tablet (50 mg total) by mouth at bedtime. (Patient taking differently: Take 50 mg by mouth daily. ) 30 tablet 2  . buprenorphine (BUTRANS - DOSED MCG/HR) 20 MCG/HR PTWK patch Place 20 mcg onto the skin every 7 (seven) days.     . Ca Carbonate-Mag Hydroxide (ROLAIDS PO) Take 1 tablet by mouth daily as needed (Heart burn).     . Cholecalciferol (VITAMIN D3) 10000 units TABS Take 1,000 Units by mouth daily.    . Coenzyme Q10 (COQ10) 200 MG CAPS Take 200 mg by mouth daily.    . cyclobenzaprine (FLEXERIL) 10 MG tablet Take 10 mg by mouth 3 (three) times daily as needed for muscle spasms.    Marland Kitchen dexlansoprazole (DEXILANT) 60 MG capsule Take 60 mg by mouth daily.    . DULoxetine (CYMBALTA) 20 MG capsule Take 10 mg by mouth 2 (two) times daily.     Marland Kitchen Fexofenadine HCl (ALLEGRA PO) Take 60 mg by mouth daily as needed (for allergy).     Marland Kitchen HYDROcodone-acetaminophen (NORCO) 10-325 MG tablet Take 1 tablet by mouth 4 (four) times daily.     . isosorbide mononitrate (IMDUR) 30 MG 24 hr tablet Take 1 tablet (30 mg total) by mouth daily. 30 tablet 2  . losartan (COZAAR) 25 MG tablet Take 1 tablet (25 mg total) by mouth daily. 90 tablet 3  . mometasone (NASONEX) 50 MCG/ACT nasal spray Place 2 sprays into the nose in the morning and at bedtime.     . Multiple Vitamin (MULTIVITAMIN WITH MINERALS) TABS tablet Take 1 tablet by mouth daily. Centrum Silver    . nitroGLYCERIN (NITROSTAT) 0.4 MG SL tablet Place 0.4 mg under the tongue every 5 (five) minutes as needed for chest pain.    Marland Kitchen Olopatadine HCl 0.6 % SOLN Place 2 sprays into the nose in the morning and at bedtime.    . Omega-3 Fatty Acids ([employer] NATURAL FISH OIL PO) Take 1 tablet by mouth daily.     . polyethylene glycol powder (MIRALAX) powder Take 1 Container by mouth daily.     . rosuvastatin (CRESTOR) 40 MG tablet Take 1 tablet (40 mg total) by mouth daily. 90 tablet 3  .  ticagrelor (BRILINTA) 90 MG TABS tablet Take 1 tablet (90 mg total) by mouth 2 (two) times daily. 60 tablet 11  . Trolamine Salicylate (BLUE-EMU HEMP EX) Apply 1 application topically daily as needed (Knee pa back pain).     . vitamin C (ASCORBIC ACID) 250 MG tablet Take 250 mg by mouth daily.     . Chlorpheniramine-APAP (CORICIDIN) 2-325 MG TABS Take 1 tablet by mouth as needed (Cold).     Marland Kitchen dextromethorphan-guaiFENesin (MUCINEX DM) 30-600 MG 12hr tablet Take 2 tablets by mouth every 12 (twelve) hours as  needed for cough.       Review of Systems: a complete, 10pt review of systems was unable to be completed due to acuity and patient intubated.  Physical Exam: Vitals:   03/21/2020 1645 04/08/2020 1646  BP:    Pulse: 97 94  Resp: (!) 21 (!) 26  Temp:  97.6 F (36.4 C)  SpO2: 100% 100%   Gen: pale, ill appearing, intubated  Eyes: lids and conjunctivae normal, no icterus. Pupils equally round and reactive to light.  Neck: trachea midline, no crepitus or hematoma Chest: on vent. Clear bilaterally.  Cardiovascular: RRR, no tachycardia, on levophed, davol container with about 260mL of blood  Gastrointestinal: soft, nondistended, nontender. No mass, hepatomegaly or splenomegaly. Pericardial drain exits a few centimeters inferior to xyphoid process, mild oozing around drain site Lymphatic: no lymphadenopathy in the neck or groin Muscoloskeletal: no clubbing or cyanosis of the fingers.  Cannot assess strength etc at this time Neuro: GCS 3T Skin: warm and dry   CBC Latest Ref Rng & Units 03/27/2020 03/25/2020 03/07/2020  WBC 4.0 - 10.5 K/uL 13.1(H) 6.3 7.1  Hemoglobin 13.0 - 17.0 g/dL 8.3(L) 12.2(L) 12.5(L)  Hematocrit 39 - 52 % 24.0(L) 34.9(L) 36.5(L)  Platelets 150 - 400 K/uL 212 276 299    CMP Latest Ref Rng & Units 03/22/2020 03/25/2020 03/07/2020  Glucose 70 - 99 mg/dL 243(H) 99 97  BUN 8 - 23 mg/dL 11 13 16   Creatinine 0.61 - 1.24 mg/dL 1.06 1.04 1.02  Sodium 135 - 145 mmol/L 130(L)  133(L) 129(L)  Potassium 3.5 - 5.1 mmol/L 3.9 3.8 4.3  Chloride 98 - 111 mmol/L 102 94(L) 91(L)  CO2 22 - 32 mmol/L 15(L) 26 23  Calcium 8.9 - 10.3 mg/dL 7.8(L) 9.8 9.6  Total Protein 6.0 - 8.5 g/dL - 6.6 -  Total Bilirubin 0.0 - 1.2 mg/dL - 0.5 -  Alkaline Phos 48 - 121 IU/L - 98 -  AST 0 - 40 IU/L - 23 -  ALT 0 - 44 IU/L - 20 -    No results found for: INR, PROTIME  Imaging: CT CHEST WO CONTRAST  Result Date: 03/30/2020 CLINICAL DATA:  84 year old male with a history of recent coronary intervention. EXAM: CT ANGIOGRAPHY CHEST, ABDOMEN AND PELVIS TECHNIQUE: Non-contrast CT of the chest was initially obtained. Multidetector CT imaging through the chest, abdomen and pelvis was performed using the standard protocol during bolus administration of intravenous contrast. Multiplanar reconstructed images and MIPs were obtained and reviewed to evaluate the vascular anatomy. CONTRAST:  80mL OMNIPAQUE IOHEXOL 350 MG/ML SOLN COMPARISON:  None. FINDINGS: CTA CHEST FINDINGS Cardiovascular: Heart: Pericardial drain from subxiphoid approach, terminates in the pericardial space, inferiorly. Small volume of gas anterior pericardial space adjacent to small volume residual hemopericardium. Note that the course of the pericardial drain crosses a branch coronary artery on image 118 of series 10, potentially contributing to some compression as there is decreased attenuation within the artery branch at this site. Right coronary: Patent stent system in the origin of the right coronary artery. Native atherosclerotic disease of the right coronary artery. Main coronary artery: Patent. Native atherosclerotic calcifications of the left anterior descending coronary artery. Small punctate density adjacent to the LAD on image 87 of series 10, which is not definitely present on the pre contrast, potentially extravasation of contrast versus artifact on this non gated study. Circumflex coronary artery: Atherosclerotic changes. Aorta:  No significant valve calcifications. Diameter of the ascending aorta measures 41 mm. Patent branch vessels. Three vessel  arch. Type 3 arch. Mild atherosclerosis of the descending thoracic aorta. No dissection or periaortic fluid. Pulmonary arteries: Timing of the contrast bolus not optimized for the pulmonary arteries. Main pulmonary artery measures 20 mm. No proximal filling defects. Mediastinum/Nodes: No mediastinal adenopathy. Unremarkable appearance of the thoracic esophagus. Unremarkable appearance of the thoracic inlet Lungs/Pleura: No pneumothorax. No pleural effusion. Peripheral ground-glass opacification in the apices of the bilateral lungs, without interlobular septal thickening. No comparison. Linear opacities at the bilateral lung bases. CTA ABDOMEN AND PELVIS FINDINGS VASCULAR Aorta: Atherosclerotic changes of the abdominal aorta. No dissection or aneurysm. No periaortic fluid. Celiac: Celiac artery patent with mild atherosclerosis. There is poor distal opacification of the splenic artery and the hepatic arteries. SMA: Atherosclerotic changes at the origin of the SMA without high-grade stenosis. 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 Aneurysm measures 5 mm, partially wall calcified. Renals: - Right: Main right renal artery patent, small caliber. There are 2 accessory right renal arteries, both small caliber arising from the 9 o'clock and 10 o'clock position of the aorta just above the main right renal artery. - Left: Main left renal artery with mild atherosclerosis and no high-grade stenosis. Small caliber left renal artery. IMA: Inferior mesenteric artery is patent. Right lower extremity: Mild atherosclerosis of the right iliac system. No high-grade stenosis or occlusion. Hypogastric artery is patent. Right common femoral artery sheath. No evidence of local hematoma aneurysm/pseudoaneurysm. No significant hemorrhage adjacent to the right  iliac artery. Proximal profunda femoris and SFA patent Left lower extremity: Mild atherosclerosis of the left iliac system. No high-grade stenosis or occlusion. Hypogastric artery is patent. External iliac artery patent. Minimal atherosclerosis of the left common femoral artery. Proximal profunda femoris and SFA patent. Veins: Right common femoral vein sheath terminates in the iliac system. Review of the MIP images confirms the above findings. NON-VASCULAR Hepatobiliary: Intermediate density fluid surrounding the liver, overlying right liver and the gallbladder. No evidence of extravasation of contrast that was identified active extravasation. Stranding overlying the left liver, in the region of the percutaneous pericardial drain. Small volume of intermediate density fluid adjacent to the left liver. Pancreas: Unremarkable. Spleen: Punctate calcifications of spleen parenchyma. Adrenals/Urinary Tract: - Right adrenal gland: Unremarkable - Left adrenal gland: Unremarkable. - Right kidney: Persistent nephrogram of the right kidney on the noncontrast CT. Retained contrast within the calices. Cortical thinning. - Left Kidney: Persistent nephrogram of the left kidney. Contrast within the calices. Cortical thinning - Urinary Bladder: Contrast within the urinary bladder. Stomach/Bowel: - Stomach: Unremarkable. - Small bowel: Unremarkable - Appendix: Appendix is not visualized, however, no inflammatory changes are present adjacent to the cecum to indicate an appendicitis. - Colon: Moderate stool burden. Diverticular disease. No focal inflammatory changes. Lymphatic: No adenopathy. Mesenteric: High density fluid, small volume within the right pelvis. Reproductive: Unremarkable prostate. Other: No hernia. Musculoskeletal: Multilevel degenerative changes of the visualized spine. Surgical changes of laminectomy spanning L3-L5. Greatest degree of canal narrowing in the thoracic region at the disc space of T9-T10 secondary to  posterior disc osteophyte complex and facet spurring posteriorly. No acute displaced fracture. Mild degenerative changes of the hips. IMPRESSION: Hemoperitoneum, with the largest volume of blood adjacent to the right liver and the gallbladder. There is no evidence of active extravasation. There is also a smaller volume of blood products adjacent to the percutaneous pericardial drain and overlying the left liver lobe. Small volume of right pelvic hemoperitoneum, likely redistributed from the upper  abdomen, as there is no evidence of vascular injury of the femoral or iliac vasculature. Small residual hemopericardium, anteriorly, with percutaneous pericardial drain in position. The course of the pericardial drain is immediately adjacent to a vascular structure at the base of the heart, potentially posterior descending artery with decreased attenuation on the CT. This may be artifact or possibly compression by the presence of the drain. Native coronary artery disease with patent right coronary artery stent system. There is a questionable focus of extraluminal contrast adjacent to the left anterior descending coronary artery, and extravasation cannot be excluded based on the imaging findings. This detail was shared with Dr. Martinique upon initial preliminary review. The above preliminary results were discussed via telephone at the time of interpretation on 04/04/2020 at 4:40 pm with Dr. Martinique. Minimal ground-glass attenuation at the periphery of the lung apices with the differential including early edema, contusion/aspiration, and/or inflammatory/infectious change. Persisting nephrogram of the bilateral kidneys on the noncontrast phase, suggesting acute renal injury. 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 Aortic Atherosclerosis (ICD10-I70.0). Additional ancillary findings as above. Signed, Dulcy Fanny. Dellia Nims, RPVI  [facility] Radiology Electronically Signed   By: Corrie Mckusick D.O.   On: 04/09/2020 16:57   CARDIAC CATHETERIZATION  Result Date: 04/02/2020 Successful pericardiocentesis for acute cardiac tamponade related to wire perforation during attempted CTO PCI of the LAD today. Plan: leave pericardial drain in place. Observe in the unit. Monitor drain output. Plan repeat Echo in the am.   CARDIAC CATHETERIZATION  Result Date: 03/15/2020  Mid LAD-1 lesion is 30% stenosed.  Mid LAD-2 lesion is 100% stenosed.  Mid LAD to Dist LAD lesion is 50% stenosed.  Mid RCA to Dist RCA lesion is 80% stenosed.  Balloon angioplasty was performed.  Ost RCA to Prox RCA lesion is 90% stenosed.  Balloon angioplasty was performed.  Ramus lesion is 80% stenosed.  Ost RCA lesion is 90% stenosed.  RPDA lesion is 100% stenosed.  Non-stenotic Ost Cx to Prox Cx lesion.  A drug-eluting stent was successfully placed using a SYNERGY XD 3.50X48.  Non-stenotic Prox RCA to Mid RCA lesion.  Post intervention, there is a 0% residual stenosis.  Post intervention, there is a 0% residual stenosis.  A drug-eluting stent was successfully placed using a SYNERGY XD 4.0X32.  Post intervention, there is a 0% residual stenosis.  Post intervention, there is a 0% residual stenosis.  Post intervention, there is a 100% residual stenosis.  1. Successful PCI of the ostium to distal RCA with rotational atherectomy and stenting with overlapping DES x 2. 2. Unsuccessful PCI of the CTO of the mid LAD due to inability to cross with a wire. The wire went subintimal and resulted in a small microperforation. Patient was hemodynamically stable throughout. Minimal effusion noted on Echo. Plan: continue DAPT for one year. Anticipate DC in am if stable. I would treat the disease in the LAD medically. This is supplied by left to left and right to left collaterals and now collateral flow from the RCA is improved.  I would not attempt CTO PCI of the LAD again.   ECHOCARDIOGRAM COMPLETE  Result Date: 03/18/2020    ECHOCARDIOGRAM REPORT   Patient Name:   Christopher Ferguson Date of Exam: 04/02/2020 Medical Rec #:  [id number]    Height:       68.0 in Accession #:    [id number]   Weight:  145.0 lb Date of Birth:  [birth year]   BSA:          1.783 m Patient Age:    65 years     BP:           120/54 mmHg Patient Gender: M            HR:           71 bpm. Exam Location:  Inpatient Procedure: Limited Echo and Limited Color Doppler                       STAT ECHO Reported to: Dr Jenkins Rouge on 03/14/2020 12:09:00 PM. Indications:    I31.3 Pericardial effusion  History:        Patient has prior history of Echocardiogram examinations, most                 recent 02/23/2020. Stroke, Arrythmias:RBBB; Risk                 Factors:Hypertension, Dyslipidemia and GERD.  Sonographer:    Jonelle Sidle Dance Referring Phys: Sheppard Coil VARANASI IMPRESSIONS  1. Left ventricular ejection fraction, by estimation, is not assessed%. The left ventricle has inadequate function. Left ventricular endocardial border not optimally defined to evaluate regional wall motion. There is not assessed left ventricular hypertrophy. Left ventricular diastolic function could not be evaluated.  2. Right ventricular systolic function is normal. The right ventricular size is normal.  3. Trivial pericardial effusion anterior to the RV new since echo done 02/23/20 . The pericardial effusion is anterior to the right ventricle.  4. The mitral valve was not assessed. N/A mitral valve regurgitation.  5. Tricuspid valve regurgitation N/A.  6. The aortic valve is normal in structure. Aortic valve regurgitation N/A.  7. Pulmonic valve regurgitation N/A. FINDINGS  Left Ventricle: Left ventricular ejection fraction, by estimation, is not assessed%. The left ventricle has inadequate function. Left ventricular endocardial border not optimally defined to evaluate regional wall motion. The  left ventricular internal cavity size was normal in size. There is not assessed left ventricular hypertrophy. Left ventricular diastolic function could not be evaluated. Right Ventricle: The right ventricular size is normal. No increase in right ventricular wall thickness. Right ventricular systolic function is normal. Left Atrium: Left atrial size was not well visualized. Right Atrium: Right atrial size was not well visualized. Pericardium: Trivial pericardial effusion anterior to the RV new since echo done 02/23/20. Trivial pericardial effusion is present. The pericardial effusion is anterior to the right ventricle. Mitral Valve: The mitral valve was not assessed. N/A mitral valve regurgitation. Tricuspid Valve: The tricuspid valve is not assessed. Tricuspid valve regurgitation N/A. Aortic Valve: The aortic valve is normal in structure. Aortic valve regurgitation N/A. Pulmonic Valve: The pulmonic valve was not assessed. Pulmonic valve regurgitation N/A. Aorta: The aortic root was not well visualized. IAS/Shunts: The interatrial septum was not assessed. Jenkins Rouge MD Electronically signed by Jenkins Rouge MD Signature Date/Time: 03/23/2020/12:18:49 PM    Final    CT Angio Chest/Abd/Pel for Dissection W and/or W/WO  Result Date: 04/03/2020 CLINICAL DATA:  84 year old male with a history of recent coronary intervention. EXAM: CT ANGIOGRAPHY CHEST, ABDOMEN AND PELVIS TECHNIQUE: Non-contrast CT of the chest was initially obtained. Multidetector CT imaging through the chest, abdomen and pelvis was performed using the standard protocol during bolus administration of intravenous contrast. Multiplanar reconstructed images and MIPs were obtained and reviewed to evaluate the vascular anatomy. CONTRAST:  43mL OMNIPAQUE  IOHEXOL 350 MG/ML SOLN COMPARISON:  None. FINDINGS: CTA CHEST FINDINGS Cardiovascular: Heart: Pericardial drain from subxiphoid approach, terminates in the pericardial space, inferiorly. Small volume of gas  anterior pericardial space adjacent to small volume residual hemopericardium. Note that the course of the pericardial drain crosses a branch coronary artery on image 118 of series 10, potentially contributing to some compression as there is decreased attenuation within the artery branch at this site. Right coronary: Patent stent system in the origin of the right coronary artery. Native atherosclerotic disease of the right coronary artery. Main coronary artery: Patent. Native atherosclerotic calcifications of the left anterior descending coronary artery. Small punctate density adjacent to the LAD on image 87 of series 10, which is not definitely present on the pre contrast, potentially extravasation of contrast versus artifact on this non gated study. Circumflex coronary artery: Atherosclerotic changes. Aorta: No significant valve calcifications. Diameter of the ascending aorta measures 41 mm. Patent branch vessels. Three vessel arch. Type 3 arch. Mild atherosclerosis of the descending thoracic aorta. No dissection or periaortic fluid. Pulmonary arteries: Timing of the contrast bolus not optimized for the pulmonary arteries. Main pulmonary artery measures 20 mm. No proximal filling defects. Mediastinum/Nodes: No mediastinal adenopathy. Unremarkable appearance of the thoracic esophagus. Unremarkable appearance of the thoracic inlet Lungs/Pleura: No pneumothorax. No pleural effusion. Peripheral ground-glass opacification in the apices of the bilateral lungs, without interlobular septal thickening. No comparison. Linear opacities at the bilateral lung bases. CTA ABDOMEN AND PELVIS FINDINGS VASCULAR Aorta: Atherosclerotic changes of the abdominal aorta. No dissection or aneurysm. No periaortic fluid. Celiac: Celiac artery patent with mild atherosclerosis. There is poor distal opacification of the splenic artery and the hepatic arteries. SMA: Atherosclerotic changes at the origin of the SMA without high-grade stenosis.  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 Aneurysm measures 5 mm, partially wall calcified. Renals: - Right: Main right renal artery patent, small caliber. There are 2 accessory right renal arteries, both small caliber arising from the 9 o'clock and 10 o'clock position of the aorta just above the main right renal artery. - Left: Main left renal artery with mild atherosclerosis and no high-grade stenosis. Small caliber left renal artery. IMA: Inferior mesenteric artery is patent. Right lower extremity: Mild atherosclerosis of the right iliac system. No high-grade stenosis or occlusion. Hypogastric artery is patent. Right common femoral artery sheath. No evidence of local hematoma aneurysm/pseudoaneurysm. No significant hemorrhage adjacent to the right iliac artery. Proximal profunda femoris and SFA patent Left lower extremity: Mild atherosclerosis of the left iliac system. No high-grade stenosis or occlusion. Hypogastric artery is patent. External iliac artery patent. Minimal atherosclerosis of the left common femoral artery. Proximal profunda femoris and SFA patent. Veins: Right common femoral vein sheath terminates in the iliac system. Review of the MIP images confirms the above findings. NON-VASCULAR Hepatobiliary: Intermediate density fluid surrounding the liver, overlying right liver and the gallbladder. No evidence of extravasation of contrast that was identified active extravasation. Stranding overlying the left liver, in the region of the percutaneous pericardial drain. Small volume of intermediate density fluid adjacent to the left liver. Pancreas: Unremarkable. Spleen: Punctate calcifications of spleen parenchyma. Adrenals/Urinary Tract: - Right adrenal gland: Unremarkable - Left adrenal gland: Unremarkable. - Right kidney: Persistent nephrogram of the right kidney on the noncontrast CT. Retained contrast within the calices. Cortical thinning. -  Left Kidney: Persistent nephrogram of the left kidney. Contrast within the calices. Cortical thinning - Urinary Bladder: Contrast within  the urinary bladder. Stomach/Bowel: - Stomach: Unremarkable. - Small bowel: Unremarkable - Appendix: Appendix is not visualized, however, no inflammatory changes are present adjacent to the cecum to indicate an appendicitis. - Colon: Moderate stool burden. Diverticular disease. No focal inflammatory changes. Lymphatic: No adenopathy. Mesenteric: High density fluid, small volume within the right pelvis. Reproductive: Unremarkable prostate. Other: No hernia. Musculoskeletal: Multilevel degenerative changes of the visualized spine. Surgical changes of laminectomy spanning L3-L5. Greatest degree of canal narrowing in the thoracic region at the disc space of T9-T10 secondary to posterior disc osteophyte complex and facet spurring posteriorly. No acute displaced fracture. Mild degenerative changes of the hips. IMPRESSION: Hemoperitoneum, with the largest volume of blood adjacent to the right liver and the gallbladder. There is no evidence of active extravasation. There is also a smaller volume of blood products adjacent to the percutaneous pericardial drain and overlying the left liver lobe. Small volume of right pelvic hemoperitoneum, likely redistributed from the upper abdomen, as there is no evidence of vascular injury of the femoral or iliac vasculature. Small residual hemopericardium, anteriorly, with percutaneous pericardial drain in position. The course of the pericardial drain is immediately adjacent to a vascular structure at the base of the heart, potentially posterior descending artery with decreased attenuation on the CT. This may be artifact or possibly compression by the presence of the drain. Native coronary artery disease with patent right coronary artery stent system. There is a questionable focus of extraluminal contrast adjacent to the left anterior descending coronary  artery, and extravasation cannot be excluded based on the imaging findings. This detail was shared with Dr. Martinique upon initial preliminary review. The above preliminary results were discussed via telephone at the time of interpretation on 03/17/2020 at 4:40 pm with Dr. Martinique. Minimal ground-glass attenuation at the periphery of the lung apices with the differential including early edema, contusion/aspiration, and/or inflammatory/infectious change. Persisting nephrogram of the bilateral kidneys on the noncontrast phase, suggesting acute renal injury. 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 Aortic Atherosclerosis (ICD10-I70.0). Additional ancillary findings as above. Signed, Dulcy Fanny. Dellia Nims, RPVI [facility] Radiology Electronically Signed   By: Corrie Mckusick D.O.   On: 04/09/2020 16:57     A/P: 84yo man with hemoperitoneum s/p pericardial drain placement, concern of injury to the left lateral liver lobe as source of ongoing hemorrhage/ shock. No extravasation on CTA; given this and small caliber of drain and location of injury expect any bleeding from the liver to resolve without intervention. Heparin is worn off at this point, but he is on aspirin and brillinta which cannot be stopped given new DES today. Given output from pericardial drain (450 initial and probably about 250 new in the drain when I saw him), this is also contributing to the drop in hgb. Agree with current plans for transfusion- would do balanced resus with PRBC/FFP/Plts. If hgb does not respond appropriately to transfusion or continues to drop and there is minimal ongoing output from the pericardial drain, please contact us.    Patient Active Problem List   Diagnosis Date Noted  . Angina pectoris ([place]) 04/04/2020  . Cardiac tamponade   . Ascending aorta dilatation ([facility])  02/25/2020  . Acute ST elevation myocardial infarction (STEMI) of inferior wall ([place]) 02/22/2020  . ST elevation myocardial infarction involving right coronary artery ([place])   . Pre-operative cardiovascular examination 08/11/2018  . CAD S/P percutaneous coronary  angioplasty   . Dyslipidemia   . Hypertension   . Spinal stenosis   . RBBB with left anterior fascicular block   . GERD (gastroesophageal reflux disease)        Romana Juniper, MD [facility] Surgery, [place]  See AMION to contact appropriate on-call provider

## 2020-03-30 NOTE — Progress Notes (Addendum)
[facility] Progress Note Patient Name: Christopher Ferguson DOB: 1936/02/01 MRN: [id number]   Date of Service  03/17/2020  HPI/Events of Note  Hypotension associated with 120 ml of frank blood from his pericardial drain.  eICU Interventions  Blood bank notified to have 4 units of blood available, stat H & H, PT-INR, PTT, Normal Saline 500 ml iv fluid bolus, Vasopressin infusion increased to 0.04 mcg, Epinephrine infusion started.  Cardiology on call was notified and is now in the room.        Kerry Kass Aaniya Sterba 03/16/2020, 10:11 PM

## 2020-03-30 NOTE — Consult Note (Addendum)
NAMEHence Ferguson, MRN:  [id number], DOB:  Jan 14, 1936, LOS: 0 ADMISSION DATE:  04/06/2020, CONSULTATION DATE:  04/08/2020 REFERRING MD: Dr. Burt Knack, CHIEF COMPLAINT: Hypotension  Brief History   Christopher Ferguson is a 84 year old with past medical history of chronic back pain, CAD status post stents in 2001 in 2009, prior CVA, hypertension, hyperlipidemia, who was admitted to the ICU 8/18 for pressor support status post pericardial drain due to PCI perforation.   History of present illness   Christopher Ferguson is a 84 year old with past medical history of chronic back pain, CAD status post stents in 2001 in 2009, prior CVA, hypertension, hyperlipidemia, who was admitted for complete occlusion of LAD status post PCI.  Unsuccessful PCI of the CTO of the mid LAD which caused a small microperforation and resulted an enlarging pericardial effusion with evidence of tamponade on echo.  Pericardial drain was placed and collected approximately 450 cc of blood.  Repeat echo showed complete resolution of pericardial effusion.  However patient still has persistent hypotension which requires Levophed.  Patient was intubated and admitted to the ICU 8/18 for pressor support and ventilator management.   Past Medical History  Hypertension Hyperlipidemia Prior CVA CAD status post stents in 2001 in 2009 Chronic back pain  Significant Hospital Events   8/18 admitted to the ICU  Consults:  Cardiology Surgery PCCM  Procedures:  8/18 PCI to LAD, pericardial drain  Significant Diagnostic Tests:  8/18 CTA Hemoperitoneum, with the largest volume of blood adjacent to the right liver and the gallbladder. There is no evidence of active extravasation. There is also a smaller volume of blood products adjacent to the percutaneous pericardial drain and overlying the left liver lobe.  Small volume of right pelvic hemoperitoneum, likely redistributed from the upper abdomen, as there is no evidence of vascular injury of  the femoral or iliac vasculature.  Small residual hemopericardium, anteriorly, with percutaneous pericardial drain in position. The course of the pericardial drain is immediately adjacent to a vascular structure at the base of the heart, potentially posterior descending artery with decreased attenuation on the CT. This may be artifact or possibly compression by the presence of the drain.  Native coronary artery disease with patent right coronary artery stent system. There is a questionable focus of extraluminal contrast adjacent to the left anterior descending coronary artery, and extravasation cannot be excluded based on the imaging findings.   Minimal ground-glass attenuation at the periphery of the lung apices with the differential including early edema, contusion/aspiration, and/or inflammatory/infectious change.  Persisting nephrogram of the bilateral kidneys on the noncontrast phase, suggesting acute renal injury.  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  Micro Data:  N/A  Antimicrobials:  N/A  Interim history/subjective:  Patient is seen at bedside.  Patient is sedated.  Pericardial drain in place  Objective   Blood pressure 99/62, pulse 94, temperature (!) 97.5 F (36.4 C), temperature source Oral, resp. rate (!) 26, height 5\' 8"  (1.727 m), weight 65.8 kg, SpO2 100 %.       No intake or output data in the 24 hours ending 04/12/2020 1651 Filed Weights   03/30/20 0654  Weight: 65.8 kg    Examination: General: Sedated, intubated HENT: ET tube in place Lungs: Normal lung sounds, no respiratory distress Cardiovascular: S1-S2, normal rate, no murmur noted Abdomen: Pericardial drain in place, still oozing Extremities: Pedal pulses palpated, no lower extremity edema Neuro: Sedated  Resolved Hospital Problem list     Assessment & Plan:  Hypovolemic  shock secondary to acute blood loss CAD with complete occlusion of LAD status post PCI Pericardial effusion due to PCI perforation Acute respiratory failure requiring mechanical ventilation Hyponatremia  Plan:  Hypovolemic shock secondary to acute blood loss CAD with complete occlusion of LAD status post PCI Pericardial effusion due to PCI perforation CTA show hemoperitoneum with blood collected and transition to right liver and gallbladder.  Questionable liver laceration secondary to pericardial drain.  Pericardial drain is in place with repeat echo shows complete resolution of pericardial effusion.  Patient received 2 units of packed red blood cells after the surgery.  Hemoglobin was 8.2.  His MAP was still in the 50s.  Levophed was started and currently at 51.  Surgery was consulted and will proceed with supportive care at this time. -Appreciate surgery recommendations -H&H every 30 minutes -Continue fluid resuscitation -Type and cross.  Massive transfusion protocol  -Continue Levophed.  Add vasopressin and epinephrine if needed to maintain MAP greater than 65. -DIC panel -Protamine for heparin reversal   Acute respiratory failure requiring mechanical ventilation -Wean as tolerated -Sedation with Versed and fentanyl as needed.  Target RASS 0 to -1 -Check blood gas -VAP bundle -Daily SBT and SAT -Lung protective strategy with plateau pressure less than 30 -Tube care and oral suction   Hyponatremia This seems to be a chronic problem.  His baseline sodium was in the 129-130. -Repeat BMP -Continue to monitor   Best practice:  Diet: N.p.o. Pain/Anxiety/Delirium protocol (if indicated): Fentanyl VAP protocol (if indicated): Yes DVT prophylaxis: SCD GI prophylaxis: N/A Glucose control: N/A Mobility: Bedrest Code Status: Full Family Communication: Update family Disposition: ICU for pressor support and ventilator management   Critical care time: 35 min     Christopher Gerold,  DO Internal Medicine Residency My pager: [phone]

## 2020-03-30 NOTE — Progress Notes (Signed)
RT NOTE: Critical ABG results given to Dr Silas Flood. Vent orders to increase RR to 30 and decreased FIO2 to 50%.

## 2020-03-30 NOTE — Progress Notes (Signed)
  Echocardiogram 2D Echocardiogram has been performed.  Christopher Ferguson 04/12/2020, 1:07 PM

## 2020-03-30 NOTE — Progress Notes (Signed)
Back to holding area from CT scan

## 2020-03-31 ENCOUNTER — Ambulatory Visit (HOSPITAL_COMMUNITY): Payer: Medicare PPO

## 2020-03-31 ENCOUNTER — Encounter (HOSPITAL_COMMUNITY): Payer: Self-pay | Admitting: Cardiology

## 2020-03-31 DIAGNOSIS — E872 Acidosis, unspecified: Secondary | ICD-10-CM

## 2020-03-31 DIAGNOSIS — K661 Hemoperitoneum: Secondary | ICD-10-CM | POA: Diagnosis not present

## 2020-03-31 DIAGNOSIS — I25119 Atherosclerotic heart disease of native coronary artery with unspecified angina pectoris: Secondary | ICD-10-CM | POA: Diagnosis not present

## 2020-03-31 DIAGNOSIS — I2582 Chronic total occlusion of coronary artery: Secondary | ICD-10-CM | POA: Diagnosis not present

## 2020-03-31 DIAGNOSIS — I509 Heart failure, unspecified: Secondary | ICD-10-CM

## 2020-03-31 DIAGNOSIS — R578 Other shock: Secondary | ICD-10-CM

## 2020-03-31 DIAGNOSIS — Z955 Presence of coronary angioplasty implant and graft: Secondary | ICD-10-CM | POA: Diagnosis not present

## 2020-03-31 DIAGNOSIS — J9601 Acute respiratory failure with hypoxia: Secondary | ICD-10-CM

## 2020-03-31 DIAGNOSIS — R579 Shock, unspecified: Secondary | ICD-10-CM | POA: Diagnosis not present

## 2020-03-31 DIAGNOSIS — Z515 Encounter for palliative care: Secondary | ICD-10-CM | POA: Diagnosis not present

## 2020-03-31 DIAGNOSIS — I110 Hypertensive heart disease with heart failure: Secondary | ICD-10-CM | POA: Diagnosis not present

## 2020-03-31 DIAGNOSIS — I2584 Coronary atherosclerosis due to calcified coronary lesion: Secondary | ICD-10-CM | POA: Diagnosis not present

## 2020-03-31 DIAGNOSIS — I209 Angina pectoris, unspecified: Secondary | ICD-10-CM | POA: Diagnosis not present

## 2020-03-31 LAB — PREPARE CRYOPRECIPITATE: Unit division: 0

## 2020-03-31 LAB — CBC
HCT: 28.6 % — ABNORMAL LOW (ref 39.0–52.0)
HCT: 26.8 % — ABNORMAL LOW (ref 39.0–52.0)
Hemoglobin: 9.3 g/dL — ABNORMAL LOW (ref 13.0–17.0)
Hemoglobin: 8.8 g/dL — ABNORMAL LOW (ref 13.0–17.0)
MCH: 30.2 pg (ref 26.0–34.0)
MCH: 30.1 pg (ref 26.0–34.0)
MCHC: 32.5 g/dL (ref 30.0–36.0)
MCHC: 32.8 g/dL (ref 30.0–36.0)
MCV: 92.9 fL (ref 80.0–100.0)
MCV: 91.8 fL (ref 80.0–100.0)
Platelets: 65 10*3/uL — ABNORMAL LOW (ref 150–400)
Platelets: 64 10*3/uL — ABNORMAL LOW (ref 150–400)
RBC: 3.08 MIL/uL — ABNORMAL LOW (ref 4.22–5.81)
RBC: 2.92 MIL/uL — ABNORMAL LOW (ref 4.22–5.81)
RDW: 14.3 % (ref 11.5–15.5)
RDW: 14.6 % (ref 11.5–15.5)
WBC: 11.8 10*3/uL — ABNORMAL HIGH (ref 4.0–10.5)
WBC: 10 10*3/uL (ref 4.0–10.5)
nRBC: 0 % (ref 0.0–0.2)
nRBC: 0 % (ref 0.0–0.2)

## 2020-03-31 LAB — BASIC METABOLIC PANEL
Anion gap: 27 — ABNORMAL HIGH (ref 5–15)
BUN: 13 mg/dL (ref 8–23)
CO2: 9 mmol/L — ABNORMAL LOW (ref 22–32)
Calcium: 7.2 mg/dL — ABNORMAL LOW (ref 8.9–10.3)
Chloride: 109 mmol/L (ref 98–111)
Creatinine, Ser: 1.73 mg/dL — ABNORMAL HIGH (ref 0.61–1.24)
GFR calc Af Amer: 42 mL/min — ABNORMAL LOW (ref >60)
GFR calc non Af Amer: 36 mL/min — ABNORMAL LOW (ref >60)
Glucose, Bld: 114 mg/dL — ABNORMAL HIGH (ref 70–99)
Potassium: 3.6 mmol/L (ref 3.5–5.1)
Sodium: 145 mmol/L (ref 135–145)

## 2020-03-31 LAB — POCT I-STAT 7, (LYTES, BLD GAS, ICA,H+H)
Acid-base deficit: 19 mmol/L — ABNORMAL HIGH (ref 0.0–2.0)
Acid-base deficit: 15 mmol/L — ABNORMAL HIGH (ref 0.0–2.0)
Bicarbonate: 7.4 mmol/L — ABNORMAL LOW (ref 20.0–28.0)
Bicarbonate: 9.8 mmol/L — ABNORMAL LOW (ref 20.0–28.0)
Calcium, Ion: 0.97 mmol/L — ABNORMAL LOW (ref 1.15–1.40)
Calcium, Ion: 0.95 mmol/L — ABNORMAL LOW (ref 1.15–1.40)
HCT: 21 % — ABNORMAL LOW (ref 39.0–52.0)
HCT: 21 % — ABNORMAL LOW (ref 39.0–52.0)
Hemoglobin: 7.1 g/dL — ABNORMAL LOW (ref 13.0–17.0)
Hemoglobin: 7.1 g/dL — ABNORMAL LOW (ref 13.0–17.0)
O2 Saturation: 98 %
O2 Saturation: 91 %
Patient temperature: 35.6
Patient temperature: 36.7
Potassium: 4.1 mmol/L (ref 3.5–5.1)
Potassium: 3.4 mmol/L — ABNORMAL LOW (ref 3.5–5.1)
Sodium: 144 mmol/L (ref 135–145)
Sodium: 145 mmol/L (ref 135–145)
TCO2: 8 mmol/L — ABNORMAL LOW (ref 22–32)
TCO2: 10 mmol/L — ABNORMAL LOW (ref 22–32)
pCO2 arterial: 17.1 mmHg — CL (ref 32.0–48.0)
pCO2 arterial: 20.6 mmHg — ABNORMAL LOW (ref 32.0–48.0)
pH, Arterial: 7.24 — ABNORMAL LOW (ref 7.350–7.450)
pH, Arterial: 7.284 — ABNORMAL LOW (ref 7.350–7.450)
pO2, Arterial: 122 mmHg — ABNORMAL HIGH (ref 83.0–108.0)
pO2, Arterial: 66 mmHg — ABNORMAL LOW (ref 83.0–108.0)

## 2020-03-31 LAB — BPAM CRYOPRECIPITATE
Blood Product Expiration Date: 202108190447
ISSUE DATE / TIME: 202108182337
Unit Type and Rh: 5100

## 2020-03-31 LAB — PHOSPHORUS: Phosphorus: 7.5 mg/dL — ABNORMAL HIGH (ref 2.5–4.6)

## 2020-03-31 LAB — PREPARE PLATELET PHERESIS: Unit division: 0

## 2020-03-31 LAB — GLUCOSE, CAPILLARY
Glucose-Capillary: 113 mg/dL — ABNORMAL HIGH (ref 70–99)
Glucose-Capillary: 83 mg/dL (ref 70–99)

## 2020-03-31 LAB — BPAM PLATELET PHERESIS
Blood Product Expiration Date: 202108192359
ISSUE DATE / TIME: 202108181842
Unit Type and Rh: 6200

## 2020-03-31 LAB — MAGNESIUM: Magnesium: 1.9 mg/dL (ref 1.7–2.4)

## 2020-03-31 LAB — LACTIC ACID, PLASMA: Lactic Acid, Venous: >11 mmol/L (ref 0.5–1.9)

## 2020-03-31 LAB — ECHOCARDIOGRAM LIMITED
Height: 68 in
Weight: 2320 oz

## 2020-03-31 MED ORDER — MIDAZOLAM HCL 2 MG/2ML IJ SOLN: 2.0000 mg | INTRAMUSCULAR | Status: DC | PRN

## 2020-03-31 MED ORDER — ROSUVASTATIN CALCIUM 20 MG PO TABS: 40.0000 mg | ORAL_TABLET | Freq: Every day | ORAL | Status: DC

## 2020-03-31 MED ORDER — ADULT MULTIVITAMIN W/MINERALS CH: 1.0000 | ORAL_TABLET | Freq: Every day | ORAL | Status: DC

## 2020-03-31 MED ORDER — CHLORHEXIDINE GLUCONATE CLOTH 2 % EX PADS: 6.0000 | MEDICATED_PAD | Freq: Every day | CUTANEOUS | Status: DC

## 2020-03-31 MED ORDER — ORAL CARE MOUTH RINSE: 15.0000 mL | OROMUCOSAL | Status: DC

## 2020-03-31 MED ORDER — CYCLOBENZAPRINE HCL 10 MG PO TABS
10.0000 mg | ORAL_TABLET | Freq: Three times a day (TID) | ORAL | Status: DC | PRN
  Filled 2020-03-31: qty 1

## 2020-03-31 MED ORDER — SODIUM BICARBONATE 8.4 % IV SOLN
INTRAVENOUS | Status: AC
  Filled 2020-03-31: qty 50

## 2020-03-31 MED ORDER — DOCUSATE SODIUM 50 MG/5ML PO LIQD: 100.0000 mg | Freq: Two times a day (BID) | ORAL | Status: DC

## 2020-03-31 MED ORDER — SODIUM BICARBONATE 8.4 % IV SOLN: 100.0000 meq | Freq: Once | INTRAVENOUS | Status: AC

## 2020-03-31 MED ORDER — SODIUM BICARBONATE 8.4 % IV SOLN
INTRAVENOUS | Status: AC
  Administered 2020-03-31: 50 meq
  Filled 2020-03-31: qty 100

## 2020-03-31 MED ORDER — ASCORBIC ACID 500 MG PO TABS: 250.0000 mg | ORAL_TABLET | Freq: Every day | ORAL | Status: DC

## 2020-03-31 MED ORDER — ACETAMINOPHEN 160 MG/5ML PO SOLN: 650.0000 mg | ORAL | Status: DC | PRN

## 2020-03-31 MED ORDER — ATROPINE SULFATE 1 % OP SOLN
2.0000 [drp] | Freq: Four times a day (QID) | OPHTHALMIC | Status: DC
  Filled 2020-03-31: qty 2

## 2020-03-31 MED ORDER — CHLORHEXIDINE GLUCONATE 0.12% ORAL RINSE (MEDLINE KIT): 15.0000 mL | Freq: Two times a day (BID) | OROMUCOSAL | Status: DC

## 2020-03-31 MED ORDER — VITAMIN D 25 MCG (1000 UNIT) PO TABS: 1000.0000 [IU] | ORAL_TABLET | Freq: Every day | ORAL | Status: DC

## 2020-03-31 MED ORDER — SODIUM BICARBONATE 8.4 % IV SOLN
INTRAVENOUS | Status: AC
  Filled 2020-03-31: qty 100

## 2020-03-31 MED ORDER — ASPIRIN 81 MG PO CHEW: 81.0000 mg | CHEWABLE_TABLET | Freq: Every day | ORAL | Status: DC

## 2020-03-31 MED ORDER — TICAGRELOR 90 MG PO TABS: 90.0000 mg | ORAL_TABLET | Freq: Two times a day (BID) | ORAL | Status: DC

## 2020-03-31 MED ORDER — POLYETHYLENE GLYCOL 3350 17 G PO PACK: 17.0000 g | PACK | Freq: Every day | ORAL | Status: DC | PRN

## 2020-03-31 MED ORDER — FENTANYL 2500MCG IN NS 250ML (10MCG/ML) PREMIX INFUSION
25.0000 ug/h | INTRAVENOUS | Status: DC
  Administered 2020-03-31: 25 ug/h via INTRAVENOUS
  Filled 2020-03-31: qty 250

## 2020-03-31 MED FILL — Verapamil HCl IV Soln 2.5 MG/ML: INTRAVENOUS | Qty: 2 | Status: AC

## 2020-03-31 MED FILL — Nitroglycerin IV Soln 200 MCG/ML in D5W: INTRAVENOUS | Qty: 250 | Status: AC

## 2020-04-01 LAB — PREPARE FRESH FROZEN PLASMA
Unit division: 0
Unit division: 0

## 2020-04-01 LAB — BPAM RBC
Blood Product Expiration Date: 202109192359
Blood Product Expiration Date: 202109192359
Blood Product Expiration Date: 202109192359
Blood Product Expiration Date: 202109192359
Blood Product Expiration Date: 202109192359
Blood Product Expiration Date: 202109192359
Blood Product Expiration Date: 202109192359
Blood Product Expiration Date: 202109192359
Blood Product Expiration Date: 202109192359
Blood Product Expiration Date: 202109192359
Blood Product Expiration Date: 202109192359
Blood Product Expiration Date: 202109192359
ISSUE DATE / TIME: 202108181640
ISSUE DATE / TIME: 202108181640
ISSUE DATE / TIME: 202108181733
ISSUE DATE / TIME: 202108181733
ISSUE DATE / TIME: 202108181738
ISSUE DATE / TIME: 202108181738
ISSUE DATE / TIME: 202108181841
ISSUE DATE / TIME: 202108181841
ISSUE DATE / TIME: 202108182208
ISSUE DATE / TIME: 202108182208
ISSUE DATE / TIME: 202108182208
ISSUE DATE / TIME: 202108182208
Unit Type and Rh: 5100
Unit Type and Rh: 5100
Unit Type and Rh: 5100
Unit Type and Rh: 5100
Unit Type and Rh: 5100
Unit Type and Rh: 5100
Unit Type and Rh: 5100
Unit Type and Rh: 5100
Unit Type and Rh: 5100
Unit Type and Rh: 5100
Unit Type and Rh: 5100
Unit Type and Rh: 5100

## 2020-04-01 LAB — TYPE AND SCREEN
ABO/RH(D): O POS
Antibody Screen: NEGATIVE
Unit division: 0
Unit division: 0
Unit division: 0
Unit division: 0
Unit division: 0
Unit division: 0
Unit division: 0
Unit division: 0
Unit division: 0
Unit division: 0
Unit division: 0
Unit division: 0

## 2020-04-01 LAB — BPAM FFP
Blood Product Expiration Date: 202108192359
Blood Product Expiration Date: 202108192359
Blood Product Expiration Date: 202108192359
Blood Product Expiration Date: 202108192359
Blood Product Expiration Date: 202108192359
Blood Product Expiration Date: 202108192359
Blood Product Expiration Date: 202108192359
Blood Product Expiration Date: 202108192359
ISSUE DATE / TIME: 202108181732
ISSUE DATE / TIME: 202108181732
ISSUE DATE / TIME: 202108181739
ISSUE DATE / TIME: 202108181739
ISSUE DATE / TIME: 202108181959
ISSUE DATE / TIME: 202108181959
Unit Type and Rh: 5100
Unit Type and Rh: 5100
Unit Type and Rh: 5100
Unit Type and Rh: 5100
Unit Type and Rh: 5100
Unit Type and Rh: 5100
Unit Type and Rh: 5100
Unit Type and Rh: 5100

## 2020-04-04 ENCOUNTER — Encounter (HOSPITAL_COMMUNITY): Payer: Self-pay | Admitting: Cardiology

## 2020-04-12 ENCOUNTER — Ambulatory Visit: Payer: Medicare PPO | Admitting: Cardiology

## 2020-04-13 NOTE — Progress Notes (Signed)
Family at bedside and expressed wishes to make pt comfort care in the setting of a poor prognosis. Support given to pt and family. They declined chaplain support being that family pastor was on the way. MD paged and awaiting new orders at this time.

## 2020-04-13 NOTE — Death Summary Note (Signed)
Death Summary    Patient ID: Christopher Ferguson MRN: [id number]; DOB: 03-13-1936  Admit Date: 2020/04/24 Date of Death: 04-25-2020  Primary Care Provider: Kristen Loader, FNP  Primary Cardiologist: Candee Furbish, MD   Discharge Diagnoses    Principal Problem:   Angina pectoris [facility]) Active Problems:   CAD S/P percutaneous coronary angioplasty   Dyslipidemia   Hypertension   Cardiac tamponade   Multiple organ failure with heart failure [facility])   Shock circulatory ([place])   Lactic acidosis   Acute respiratory failure with hypoxia ([place])  Diagnostic Studies/Procedures    Coronary stent intervention 04/24/20:   Mid LAD-1 lesion is 30% stenosed.  Mid LAD-2 lesion is 100% stenosed.  Mid LAD to Dist LAD lesion is 50% stenosed.  Mid RCA to Dist RCA lesion is 80% stenosed.  Balloon angioplasty was performed.  Ost RCA to Prox RCA lesion is 90% stenosed.  Balloon angioplasty was performed.  Ramus lesion is 80% stenosed.  Ost RCA lesion is 90% stenosed.  RPDA lesion is 100% stenosed.  Non-stenotic Ost Cx to Prox Cx lesion.  A drug-eluting stent was successfully placed using a SYNERGY XD 3.50X48.  Non-stenotic Prox RCA to Mid RCA lesion.  Post intervention, there is a 0% residual stenosis.  Post intervention, there is a 0% residual stenosis.  A drug-eluting stent was successfully placed using a SYNERGY XD 4.0X32.  Post intervention, there is a 0% residual stenosis.  Post intervention, there is a 0% residual stenosis.  Post intervention, there is a 100% residual stenosis.   1. Successful PCI of the ostium to distal RCA with rotational atherectomy and stenting with overlapping DES x 2. 2. Unsuccessful PCI of the CTO of the mid LAD due to inability to cross with a wire. The wire went subintimal and resulted in a small microperforation. Patient was hemodynamically stable throughout. Minimal effusion noted on Echo.  Plan: continue DAPT for one year. Anticipate DC in am if  stable. I would treat the disease in the LAD medically. This is supplied by left to left and right to left collaterals and now collateral flow from the RCA is improved. I would not attempt CTO PCI of the LAD again.   Echocardiogram April 24, 2020:  1. Left ventricular ejection fraction, by estimation, is not assessed%.  The left ventricle has inadequate function. Left ventricular endocardial  border not optimally defined to evaluate regional wall motion. There is  not assessed left ventricular  hypertrophy. Left ventricular diastolic function could not be evaluated.  2. Right ventricular systolic function is normal. The right ventricular  size is normal.  3. Trivial pericardial effusion anterior to the RV new since echo done  02/23/20 . The pericardial effusion is anterior to the right ventricle.  4. The mitral valve was not assessed. N/A mitral valve regurgitation.  5. Tricuspid valve regurgitation N/A.  6. The aortic valve is normal in structure. Aortic valve regurgitation  N/A.  7. Pulmonic valve regurgitation N/A.   Limited echocardiogram Apr 24, 2020:  1. Post CTO intervention Marked increase in pericardial effusion compared  to earlier echo today with tamponade. Patient is already in cath lab with  Dr Martinique for pericardiocentesis  Pericardioentesis 04/24/2020:  Successful pericardiocentesis for acute cardiac tamponade related to wire perforation during attempted CTO PCI of the LAD today.  Plan: leave pericardial drain in place. Observe in the unit. Monitor drain output. Plan repeat Echo in the am.    Limited echocardiogram 2020-04-24:  1. Post pericardiocentesis with significant decrease in size of effusion  and improved RV cavity expansion Still with small persistent effusion over  RV free wall Apparently pig tail catheter left in pericardial space.   History of Present Illness     Christopher Ferguson is a 84 y.o. male with a history of chronic back pain, CAD s/p coronary stenting in  2001 and 2009, chronically occluded Circumflex, prior CVA, HLD, HTN, RBBB and GERD.  Christopher Ferguson presented to the ER 02/22/20 with chest pain and dyspnea. Second EKG showed ST elevation therefore he was taken to the cath lab emergent cardiac catheterization. This showed severe multivessel disease with 60% of the ostial to proximal RCA followed by 100% thrombotic occlusion of proximal to mid RCA and 50% stenosis of the mid to distal RCA as well as 100% stenosis of the ostial to proximal CX, and 100% stenosis stenosis of mid LAD. The most proximal stent in the mid LAD was  patent and the vessel was occluded beyond this stent. Distal vessel did have left to left bridging collaterals. LCX also filled from collaterals. RCA treated with balloon angioplasty and flow was restored with no stent was placement.  Echocardiogram showedLVEF of 55-60% with normal wall motion, mild AI, and moderate dilatation of ascending aorta measuring 58mm.Cath films reviewed with CT surgery; however, not felt to be favorable for CABG due to poor targets. From an interventional standpoint, Dr. Angelena Form reviewed case with the CTO team andpatient was to come back forCTO/ PCI of the LAD and relook at the RCA at that time. Home Plavix was stopped and he was started on Brilinta 90mg  twice daily in addition to Aspirin 81mg  daily.Atenolol reduced to 50mg  due to bradycardia.CTO/PCI was planned for mid August.   He was then seen in follow up 03/09/20 with most concern being his back pain. Overall he was doing well from a cardiology standpoint and procedure was then scheduled for 03/21/2020 with Dr. Martinique.   Hospital Course   He then presented for OP cath 03/21/2020 for coronary stent intervention with successful PCI of the ostium to distal RCA with rotational atherectomy and stenting with overlapping DES x 2 and unsuccessful PCI of the CTO of the mid LAD due to inability to cross with a wire. The wire went subintimal and resulted in a small  microperforation. Patient was hemodynamically stable throughout the case with minimal effusion noted on Echo. Initial plan was keep the patient overnight and discharge on Apr 22, 2020 given his stable status.   Given hemodynamic deterioration approximately one hour after his procedure, limited echocardiogram was performed which showed a marked increase in pericardial effusion with tamponade at which time PCCM were consulted to shock. Hb was noted to have dropped from 12.2 to 8.3. He underwent pericardiocentesis with some stabilization however with persistently worsening hemodynamics and respiratory distress, he was ultimately intubated for airway protection. CT was performed which showed hemoperitoneum predominantly in the RUQ around the liver with concern for injury to the left lateral liver lobe as a source of ongoing hemorrhage. He was fluid resuscitated with NS and blood products including PRBC>FFP>Plts. He was then placed on epi, norepi and vasopressin infusions and remained hypotensive. Lactate elevated to 11 and patient remained acidemic despite bicarb infusion with full pressor support as above.   Given his course with multiorgan failure, continued hypotension depits aggressive support, acidemia and climbing lactate, situation discussed between Dr. Burt Knack and family who were at bedside with recommendations for full comfort support. Case also discussed with Dr. Windle Guard and PCCM team who agreed during a family meeting.  He was terminally extubated to [place] 2L with pain control on 2020-04-04 at 0935am and expired at 0955am with family at bedside and confirmed with two RN's.   Consultants: PCCM, general surgery   Time of death: 2020/04/04 at 9am  _____________  Labs & Radiologic Studies    CBC No results for input(s): WBC, NEUTROABS, HGB, HCT, MCV, PLT in the last 72 hours. Basic Metabolic Panel No results for input(s): NA, K, CL, CO2, GLUCOSE, BUN, CREATININE, CALCIUM, MG, PHOS in the last 72  hours. Liver Function Tests No results for input(s): AST, ALT, ALKPHOS, BILITOT, PROT, ALBUMIN in the last 72 hours. No results for input(s): LIPASE, AMYLASE in the last 72 hours. High Sensitivity Troponin:   No results for input(s): TROPONINIHS in the last 720 hours.  BNP Invalid input(s): POCBNP D-Dimer No results for input(s): DDIMER in the last 72 hours. Hemoglobin A1C No results for input(s): HGBA1C in the last 72 hours. Fasting Lipid Panel No results for input(s): CHOL, HDL, LDLCALC, TRIG, CHOLHDL, LDLDIRECT in the last 72 hours. Thyroid Function Tests No results for input(s): TSH, T4TOTAL, T3FREE, THYROIDAB in the last 72 hours.  Invalid input(s): FREET3 _____________  CT CHEST WO CONTRAST  Result Date: 03/27/2020 CLINICAL DATA:  84 year old male with a history of recent coronary intervention. EXAM: CT ANGIOGRAPHY CHEST, ABDOMEN AND PELVIS TECHNIQUE: Non-contrast CT of the chest was initially obtained. Multidetector CT imaging through the chest, abdomen and pelvis was performed using the standard protocol during bolus administration of intravenous contrast. Multiplanar reconstructed images and MIPs were obtained and reviewed to evaluate the vascular anatomy. CONTRAST:  56mL OMNIPAQUE IOHEXOL 350 MG/ML SOLN COMPARISON:  None. FINDINGS: CTA CHEST FINDINGS Cardiovascular: Heart: Pericardial drain from subxiphoid approach, terminates in the pericardial space, inferiorly. Small volume of gas anterior pericardial space adjacent to small volume residual hemopericardium. Note that the course of the pericardial drain crosses a branch coronary artery on image 118 of series 10, potentially contributing to some compression as there is decreased attenuation within the artery branch at this site. Right coronary: Patent stent system in the origin of the right coronary artery. Native atherosclerotic disease of the right coronary artery. Main coronary artery: Patent. Native atherosclerotic calcifications  of the left anterior descending coronary artery. Small punctate density adjacent to the LAD on image 87 of series 10, which is not definitely present on the pre contrast, potentially extravasation of contrast versus artifact on this non gated study. Circumflex coronary artery: Atherosclerotic changes. Aorta: No significant valve calcifications. Diameter of the ascending aorta measures 41 mm. Patent branch vessels. Three vessel arch. Type 3 arch. Mild atherosclerosis of the descending thoracic aorta. No dissection or periaortic fluid. Pulmonary arteries: Timing of the contrast bolus not optimized for the pulmonary arteries. Main pulmonary artery measures 20 mm. No proximal filling defects. Mediastinum/Nodes: No mediastinal adenopathy. Unremarkable appearance of the thoracic esophagus. Unremarkable appearance of the thoracic inlet Lungs/Pleura: No pneumothorax. No pleural effusion. Peripheral ground-glass opacification in the apices of the bilateral lungs, without interlobular septal thickening. No comparison. Linear opacities at the bilateral lung bases. CTA ABDOMEN AND PELVIS FINDINGS VASCULAR Aorta: Atherosclerotic changes of the abdominal aorta. No dissection or aneurysm. No periaortic fluid. Celiac: Celiac artery patent with mild atherosclerosis. There is poor distal opacification of the splenic artery and the hepatic arteries. SMA: Atherosclerotic changes at the origin of the SMA without high-grade stenosis. 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 Aneurysm measures 5 mm, partially wall  calcified. Renals: - Right: Main right renal artery patent, small caliber. There are 2 accessory right renal arteries, both small caliber arising from the 9 o'clock and 10 o'clock position of the aorta just above the main right renal artery. - Left: Main left renal artery with mild atherosclerosis and no high-grade stenosis. Small caliber left renal artery. IMA:  Inferior mesenteric artery is patent. Right lower extremity: Mild atherosclerosis of the right iliac system. No high-grade stenosis or occlusion. Hypogastric artery is patent. Right common femoral artery sheath. No evidence of local hematoma aneurysm/pseudoaneurysm. No significant hemorrhage adjacent to the right iliac artery. Proximal profunda femoris and SFA patent Left lower extremity: Mild atherosclerosis of the left iliac system. No high-grade stenosis or occlusion. Hypogastric artery is patent. External iliac artery patent. Minimal atherosclerosis of the left common femoral artery. Proximal profunda femoris and SFA patent. Veins: Right common femoral vein sheath terminates in the iliac system. Review of the MIP images confirms the above findings. NON-VASCULAR Hepatobiliary: Intermediate density fluid surrounding the liver, overlying right liver and the gallbladder. No evidence of extravasation of contrast that was identified active extravasation. Stranding overlying the left liver, in the region of the percutaneous pericardial drain. Small volume of intermediate density fluid adjacent to the left liver. Pancreas: Unremarkable. Spleen: Punctate calcifications of spleen parenchyma. Adrenals/Urinary Tract: - Right adrenal gland: Unremarkable - Left adrenal gland: Unremarkable. - Right kidney: Persistent nephrogram of the right kidney on the noncontrast CT. Retained contrast within the calices. Cortical thinning. - Left Kidney: Persistent nephrogram of the left kidney. Contrast within the calices. Cortical thinning - Urinary Bladder: Contrast within the urinary bladder. Stomach/Bowel: - Stomach: Unremarkable. - Small bowel: Unremarkable - Appendix: Appendix is not visualized, however, no inflammatory changes are present adjacent to the cecum to indicate an appendicitis. - Colon: Moderate stool burden. Diverticular disease. No focal inflammatory changes. Lymphatic: No adenopathy. Mesenteric: High density fluid,  small volume within the right pelvis. Reproductive: Unremarkable prostate. Other: No hernia. Musculoskeletal: Multilevel degenerative changes of the visualized spine. Surgical changes of laminectomy spanning L3-L5. Greatest degree of canal narrowing in the thoracic region at the disc space of T9-T10 secondary to posterior disc osteophyte complex and facet spurring posteriorly. No acute displaced fracture. Mild degenerative changes of the hips. IMPRESSION: Hemoperitoneum, with the largest volume of blood adjacent to the right liver and the gallbladder. There is no evidence of active extravasation. There is also a smaller volume of blood products adjacent to the percutaneous pericardial drain and overlying the left liver lobe. Small volume of right pelvic hemoperitoneum, likely redistributed from the upper abdomen, as there is no evidence of vascular injury of the femoral or iliac vasculature. Small residual hemopericardium, anteriorly, with percutaneous pericardial drain in position. The course of the pericardial drain is immediately adjacent to a vascular structure at the base of the heart, potentially posterior descending artery with decreased attenuation on the CT. This may be artifact or possibly compression by the presence of the drain. Native coronary artery disease with patent right coronary artery stent system. There is a questionable focus of extraluminal contrast adjacent to the left anterior descending coronary artery, and extravasation cannot be excluded based on the imaging findings. This detail was shared with Dr. Martinique upon initial preliminary review. The above preliminary results were discussed via telephone at the time of interpretation on 03/21/2020 at 4:40 pm with Dr. Martinique. Minimal ground-glass attenuation at the periphery of the lung apices with the differential including early edema, contusion/aspiration, and/or inflammatory/infectious change. Persisting nephrogram  of the bilateral kidneys on  the noncontrast phase, suggesting acute renal injury. 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 Aortic Atherosclerosis (ICD10-I70.0). Additional ancillary findings as above. Signed, Dulcy Fanny. Dellia Nims, RPVI [facility] Electronically Signed   By: Corrie Mckusick D.O.   On: 04/04/2020 16:57   CARDIAC CATHETERIZATION  Result Date: 04/08/2020 Successful pericardiocentesis for acute cardiac tamponade related to wire perforation during attempted CTO PCI of the LAD today. Plan: leave pericardial drain in place. Observe in the unit. Monitor drain output. Plan repeat Echo in the am.   CARDIAC CATHETERIZATION  Result Date: 03/15/2020  Mid LAD-1 lesion is 30% stenosed.  Mid LAD-2 lesion is 100% stenosed.  Mid LAD to Dist LAD lesion is 50% stenosed.  Mid RCA to Dist RCA lesion is 80% stenosed.  Balloon angioplasty was performed.  Ost RCA to Prox RCA lesion is 90% stenosed.  Balloon angioplasty was performed.  Ramus lesion is 80% stenosed.  Ost RCA lesion is 90% stenosed.  RPDA lesion is 100% stenosed.  Non-stenotic Ost Cx to Prox Cx lesion.  A drug-eluting stent was successfully placed using a SYNERGY XD 3.50X48.  Non-stenotic Prox RCA to Mid RCA lesion.  Post intervention, there is a 0% residual stenosis.  Post intervention, there is a 0% residual stenosis.  A drug-eluting stent was successfully placed using a SYNERGY XD 4.0X32.  Post intervention, there is a 0% residual stenosis.  Post intervention, there is a 0% residual stenosis.  Post intervention, there is a 100% residual stenosis.  1. Successful PCI of the ostium to distal RCA with rotational atherectomy and stenting with overlapping DES x 2. 2. Unsuccessful PCI of the CTO of the mid LAD due to inability to cross with a wire. The wire went subintimal and resulted in a small  microperforation. Patient was hemodynamically stable throughout. Minimal effusion noted on Echo. Plan: continue DAPT for one year. Anticipate DC in am if stable. I would treat the disease in the LAD medically. This is supplied by left to left and right to left collaterals and now collateral flow from the RCA is improved. I would not attempt CTO PCI of the LAD again.   ECHOCARDIOGRAM COMPLETE  Result Date: 04/07/2020    ECHOCARDIOGRAM REPORT   Patient Name:   Christopher Ferguson Date of Exam: 04/09/2020 Medical Rec #:  [id number]    Height:       68.0 in Accession #:    [id number]   Weight:       145.0 lb Date of Birth:  [birth year]   BSA:          1.783 m Patient Age:    18 years     BP:           120/54 mmHg Patient Gender: M            HR:           71 bpm. Exam Location:  Inpatient Procedure: Limited Echo and Limited Color Doppler                       STAT ECHO Reported to: Dr Jenkins Rouge on 04/07/2020 12:09:00 PM. Indications:    I31.3 Pericardial effusion  History:        Patient has prior history of Echocardiogram examinations, most  recent 02/23/2020. Stroke, Arrythmias:RBBB; Risk                 Factors:Hypertension, Dyslipidemia and GERD.  Sonographer:    Jonelle Sidle Dance Referring Phys: Sheppard Coil VARANASI IMPRESSIONS  1. Left ventricular ejection fraction, by estimation, is not assessed%. The left ventricle has inadequate function. Left ventricular endocardial border not optimally defined to evaluate regional wall motion. There is not assessed left ventricular hypertrophy. Left ventricular diastolic function could not be evaluated.  2. Right ventricular systolic function is normal. The right ventricular size is normal.  3. Trivial pericardial effusion anterior to the RV new since echo done 02/23/20 . The pericardial effusion is anterior to the right ventricle.  4. The mitral valve was not assessed. N/A mitral valve regurgitation.  5. Tricuspid valve regurgitation N/A.  6. The aortic valve is  normal in structure. Aortic valve regurgitation N/A.  7. Pulmonic valve regurgitation N/A. FINDINGS  Left Ventricle: Left ventricular ejection fraction, by estimation, is not assessed%. The left ventricle has inadequate function. Left ventricular endocardial border not optimally defined to evaluate regional wall motion. The left ventricular internal cavity size was normal in size. There is not assessed left ventricular hypertrophy. Left ventricular diastolic function could not be evaluated. Right Ventricle: The right ventricular size is normal. No increase in right ventricular wall thickness. Right ventricular systolic function is normal. Left Atrium: Left atrial size was not well visualized. Right Atrium: Right atrial size was not well visualized. Pericardium: Trivial pericardial effusion anterior to the RV new since echo done 02/23/20. Trivial pericardial effusion is present. The pericardial effusion is anterior to the right ventricle. Mitral Valve: The mitral valve was not assessed. N/A mitral valve regurgitation. Tricuspid Valve: The tricuspid valve is not assessed. Tricuspid valve regurgitation N/A. Aortic Valve: The aortic valve is normal in structure. Aortic valve regurgitation N/A. Pulmonic Valve: The pulmonic valve was not assessed. Pulmonic valve regurgitation N/A. Aorta: The aortic root was not well visualized. IAS/Shunts: The interatrial septum was not assessed. Jenkins Rouge MD Electronically signed by Jenkins Rouge MD Signature Date/Time: 03/26/2020/12:18:49 PM    Final    CT Angio Chest/Abd/Pel for Dissection W and/or W/WO  Result Date: 03/25/2020 CLINICAL DATA:  84 year old male with a history of recent coronary intervention. EXAM: CT ANGIOGRAPHY CHEST, ABDOMEN AND PELVIS TECHNIQUE: Non-contrast CT of the chest was initially obtained. Multidetector CT imaging through the chest, abdomen and pelvis was performed using the standard protocol during bolus administration of intravenous contrast.  Multiplanar reconstructed images and MIPs were obtained and reviewed to evaluate the vascular anatomy. CONTRAST:  60mL OMNIPAQUE IOHEXOL 350 MG/ML SOLN COMPARISON:  None. FINDINGS: CTA CHEST FINDINGS Cardiovascular: Heart: Pericardial drain from subxiphoid approach, terminates in the pericardial space, inferiorly. Small volume of gas anterior pericardial space adjacent to small volume residual hemopericardium. Note that the course of the pericardial drain crosses a branch coronary artery on image 118 of series 10, potentially contributing to some compression as there is decreased attenuation within the artery branch at this site. Right coronary: Patent stent system in the origin of the right coronary artery. Native atherosclerotic disease of the right coronary artery. Main coronary artery: Patent. Native atherosclerotic calcifications of the left anterior descending coronary artery. Small punctate density adjacent to the LAD on image 87 of series 10, which is not definitely present on the pre contrast, potentially extravasation of contrast versus artifact on this non gated study. Circumflex coronary artery: Atherosclerotic changes. Aorta: No significant valve calcifications. Diameter of the ascending  aorta measures 41 mm. Patent branch vessels. Three vessel arch. Type 3 arch. Mild atherosclerosis of the descending thoracic aorta. No dissection or periaortic fluid. Pulmonary arteries: Timing of the contrast bolus not optimized for the pulmonary arteries. Main pulmonary artery measures 20 mm. No proximal filling defects. Mediastinum/Nodes: No mediastinal adenopathy. Unremarkable appearance of the thoracic esophagus. Unremarkable appearance of the thoracic inlet Lungs/Pleura: No pneumothorax. No pleural effusion. Peripheral ground-glass opacification in the apices of the bilateral lungs, without interlobular septal thickening. No comparison. Linear opacities at the bilateral lung bases. CTA ABDOMEN AND PELVIS FINDINGS  VASCULAR Aorta: Atherosclerotic changes of the abdominal aorta. No dissection or aneurysm. No periaortic fluid. Celiac: Celiac artery patent with mild atherosclerosis. There is poor distal opacification of the splenic artery and the hepatic arteries. SMA: Atherosclerotic changes at the origin of the SMA without high-grade stenosis. 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 Aneurysm measures 5 mm, partially wall calcified. Renals: - Right: Main right renal artery patent, small caliber. There are 2 accessory right renal arteries, both small caliber arising from the 9 o'clock and 10 o'clock position of the aorta just above the main right renal artery. - Left: Main left renal artery with mild atherosclerosis and no high-grade stenosis. Small caliber left renal artery. IMA: Inferior mesenteric artery is patent. Right lower extremity: Mild atherosclerosis of the right iliac system. No high-grade stenosis or occlusion. Hypogastric artery is patent. Right common femoral artery sheath. No evidence of local hematoma aneurysm/pseudoaneurysm. No significant hemorrhage adjacent to the right iliac artery. Proximal profunda femoris and SFA patent Left lower extremity: Mild atherosclerosis of the left iliac system. No high-grade stenosis or occlusion. Hypogastric artery is patent. External iliac artery patent. Minimal atherosclerosis of the left common femoral artery. Proximal profunda femoris and SFA patent. Veins: Right common femoral vein sheath terminates in the iliac system. Review of the MIP images confirms the above findings. NON-VASCULAR Hepatobiliary: Intermediate density fluid surrounding the liver, overlying right liver and the gallbladder. No evidence of extravasation of contrast that was identified active extravasation. Stranding overlying the left liver, in the region of the percutaneous pericardial drain. Small volume of intermediate density fluid adjacent  to the left liver. Pancreas: Unremarkable. Spleen: Punctate calcifications of spleen parenchyma. Adrenals/Urinary Tract: - Right adrenal gland: Unremarkable - Left adrenal gland: Unremarkable. - Right kidney: Persistent nephrogram of the right kidney on the noncontrast CT. Retained contrast within the calices. Cortical thinning. - Left Kidney: Persistent nephrogram of the left kidney. Contrast within the calices. Cortical thinning - Urinary Bladder: Contrast within the urinary bladder. Stomach/Bowel: - Stomach: Unremarkable. - Small bowel: Unremarkable - Appendix: Appendix is not visualized, however, no inflammatory changes are present adjacent to the cecum to indicate an appendicitis. - Colon: Moderate stool burden. Diverticular disease. No focal inflammatory changes. Lymphatic: No adenopathy. Mesenteric: High density fluid, small volume within the right pelvis. Reproductive: Unremarkable prostate. Other: No hernia. Musculoskeletal: Multilevel degenerative changes of the visualized spine. Surgical changes of laminectomy spanning L3-L5. Greatest degree of canal narrowing in the thoracic region at the disc space of T9-T10 secondary to posterior disc osteophyte complex and facet spurring posteriorly. No acute displaced fracture. Mild degenerative changes of the hips. IMPRESSION: Hemoperitoneum, with the largest volume of blood adjacent to the right liver and the gallbladder. There is no evidence of active extravasation. There is also a smaller volume of blood products adjacent to the percutaneous pericardial drain and overlying the left liver lobe. Small volume  of right pelvic hemoperitoneum, likely redistributed from the upper abdomen, as there is no evidence of vascular injury of the femoral or iliac vasculature. Small residual hemopericardium, anteriorly, with percutaneous pericardial drain in position. The course of the pericardial drain is immediately adjacent to a vascular structure at the base of the heart,  potentially posterior descending artery with decreased attenuation on the CT. This may be artifact or possibly compression by the presence of the drain. Native coronary artery disease with patent right coronary artery stent system. There is a questionable focus of extraluminal contrast adjacent to the left anterior descending coronary artery, and extravasation cannot be excluded based on the imaging findings. This detail was shared with Dr. Martinique upon initial preliminary review. The above preliminary results were discussed via telephone at the time of interpretation on 03/14/2020 at 4:40 pm with Dr. Martinique. Minimal ground-glass attenuation at the periphery of the lung apices with the differential including early edema, contusion/aspiration, and/or inflammatory/infectious change. Persisting nephrogram of the bilateral kidneys on the noncontrast phase, suggesting acute renal injury. 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 Aortic Atherosclerosis (ICD10-I70.0). Additional ancillary findings as above. Signed, Dulcy Fanny. Dellia Nims, RPVI [facility] Electronically Signed   By: Corrie Mckusick D.O.   On: 03/22/2020 16:57   ECHOCARDIOGRAM LIMITED  Result Date: 04/11/2020    ECHOCARDIOGRAM LIMITED REPORT   Patient Name:   Christopher Ferguson Date of Exam: 03/15/2020 Medical Rec #:  [id number]    Height:       68.0 in Accession #:    [id number]   Weight:       145.0 lb Date of Birth:  23-Oct-1935   BSA:          1.783 m Patient Age:    52 years     BP:           72/53 mmHg Patient Gender: M            HR:           75 bpm. Exam Location:  Inpatient Procedure: Limited Echo STAT ECHO Indications:    Pericardial Effusion  History:        Patient has prior history of Echocardiogram examinations, most                 recent 03/24/2020. CAD; Risk Factors:Dyslipidemia.   Sonographer:    Mikki Santee RDCS (AE) Referring Phys: Sherren Mocha MD IMPRESSIONS  1. Post pericardiocentesis with significant decrease in size of effusion and improved RV cavity expansion Still with small persistent effusion over RV free wall Apparently pig tail catheter left in pericardial space. FINDINGS  Pericardium: Post pericardiocentesis with significant decrease in size of effusion and improved RV cavity expansion Still with small persistent effusion over RV free wall Apparently pig tail catheter left in pericardial space. Jenkins Rouge MD Electronically signed by Jenkins Rouge MD Signature Date/Time: 03/29/2020/3:22:06 PM    Final    ECHOCARDIOGRAM LIMITED  Result Date: 03/17/2020    ECHOCARDIOGRAM LIMITED REPORT   Patient Name:   Christopher Ferguson Date of Exam: 04/05/2020 Medical Rec #:  [id number]    Height:       68.0 in Accession #:    [id number]   Weight:       145.0 lb Date of Birth:  06-18-1936   BSA:          1.783 m Patient  Age:    33 years     BP:           75/51 mmHg Patient Gender: M            HR:           78 bpm. Exam Location:  Inpatient Procedure: Limited Echo Indications:    Pericardial effusion  History:        Patient has prior history of Echocardiogram examinations, most                 recent 03/14/2020. Pericardial Disease, CAD and Previous                 Myocardial Infarction, Stroke, Arrythmias:RBBB,                 Signs/Symptoms:Shortness of Breath and Chest Pain; Risk                 Factors:Hypertension and Dyslipidemia.  Sonographer:    Dustin Flock Referring Phys: Sherren Mocha IMPRESSIONS  1. Post CTO intervention Marked increase in pericardial effusion compared to earlier echo today with tamponade. Patient is already in cath lab with Dr Martinique for pericardiocentesis. FINDINGS  Pericardium: Post CTO intervention Marked increase in pericardial effusion compared to earlier echo today with tamponade. Patient is already in cath lab with Dr Martinique for pericardiocentesis.  Jenkins Rouge MD Electronically signed by Jenkins Rouge MD Signature Date/Time: 04/03/2020/1:38:33 PM    Final     Duration of Death Summary Encounter   Greater than 30 minutes including physician time.  Signed, Kathyrn Drown, NP 04/04/2020, 11:54 AM

## 2020-04-13 NOTE — Progress Notes (Signed)
1 Day Post-Op   Subjective/Chief Complaint: Continued MSOF overnight   Objective: Vital signs in last 24 hours: Temp:  [95.9 F (35.5 C)-99 F (37.2 C)] 98.1 F (36.7 C) (08/19 0822) Pulse Rate:  [0-151] 117 (08/19 0822) Resp:  [0-44] 38 (08/19 0822) BP: (51-168)/(32-120) 74/45 (08/19 0700) SpO2:  [0 %-100 %] 100 % (08/19 0822) Arterial Line BP: (64-134)/(39-62) 79/39 (08/19 0700) FiO2 (%):  [40 %-100 %] 40 % (08/19 0822)    Intake/Output from previous day: 08/18 0701 - 08/19 0700 In: 3723 [I.V.:2439.5; Blood:1182.9; IV Piggyback:100.6] Out: 820 [Urine:535; Drains:285] Intake/Output this shift: No intake/output data recorded.  General appearance: ill appearing, on vent Resp: symmetrical air entry, dyssynchronous Cardio: max dose multiple pressors GI: distended  Lab Results:  Recent Labs    04/25/20 0122 04/25/20 0122 April 25, 2020 0400 04-25-20 0416  WBC 11.8*  --  10.0  --   HGB 9.3*   < > 8.8* 7.1*  HCT 28.6*   < > 26.8* 21.0*  PLT 65*  --  64*  --    < > = values in this interval not displayed.   BMET Recent Labs    03/27/2020 1530 03/18/2020 1810 2020-04-25 0400 2020-04-25 0416  NA 130*   < > 145 145  K 3.9   < > 3.6 3.4*  CL 102  --  109  --   CO2 15*  --  9*  --   GLUCOSE 243*  --  114*  --   BUN 11  --  13  --   CREATININE 1.06  --  1.73*  --   CALCIUM 7.8*  --  7.2*  --    < > = values in this interval not displayed.   PT/INR Recent Labs    03/18/2020 1734 03/28/2020 2215  LABPROT 22.7* 19.8*  INR 2.1* 1.8*   ABG Recent Labs    04/25/2020 0116 Apr 25, 2020 0416  PHART 7.240* 7.284*  HCO3 7.4* 9.8*    Studies/Results: CT CHEST WO CONTRAST  Result Date: 03/23/2020 CLINICAL DATA:  84 year old male with a history of recent coronary intervention. EXAM: CT ANGIOGRAPHY CHEST, ABDOMEN AND PELVIS TECHNIQUE: Non-contrast CT of the chest was initially obtained. Multidetector CT imaging through the chest, abdomen and pelvis was performed using the standard  protocol during bolus administration of intravenous contrast. Multiplanar reconstructed images and MIPs were obtained and reviewed to evaluate the vascular anatomy. CONTRAST:  76mL OMNIPAQUE IOHEXOL 350 MG/ML SOLN COMPARISON:  None. FINDINGS: CTA CHEST FINDINGS Cardiovascular: Heart: Pericardial drain from subxiphoid approach, terminates in the pericardial space, inferiorly. Small volume of gas anterior pericardial space adjacent to small volume residual hemopericardium. Note that the course of the pericardial drain crosses a branch coronary artery on image 118 of series 10, potentially contributing to some compression as there is decreased attenuation within the artery branch at this site. Right coronary: Patent stent system in the origin of the right coronary artery. Native atherosclerotic disease of the right coronary artery. Main coronary artery: Patent. Native atherosclerotic calcifications of the left anterior descending coronary artery. Small punctate density adjacent to the LAD on image 87 of series 10, which is not definitely present on the pre contrast, potentially extravasation of contrast versus artifact on this non gated study. Circumflex coronary artery: Atherosclerotic changes. Aorta: No significant valve calcifications. Diameter of the ascending aorta measures 41 mm. Patent branch vessels. Three vessel arch. Type 3 arch. Mild atherosclerosis of the descending thoracic aorta. No dissection or periaortic fluid. Pulmonary arteries: Timing  of the contrast bolus not optimized for the pulmonary arteries. Main pulmonary artery measures 20 mm. No proximal filling defects. Mediastinum/Nodes: No mediastinal adenopathy. Unremarkable appearance of the thoracic esophagus. Unremarkable appearance of the thoracic inlet Lungs/Pleura: No pneumothorax. No pleural effusion. Peripheral ground-glass opacification in the apices of the bilateral lungs, without interlobular septal thickening. No comparison. Linear opacities  at the bilateral lung bases. CTA ABDOMEN AND PELVIS FINDINGS VASCULAR Aorta: Atherosclerotic changes of the abdominal aorta. No dissection or aneurysm. No periaortic fluid. Celiac: Celiac artery patent with mild atherosclerosis. There is poor distal opacification of the splenic artery and the hepatic arteries. SMA: Atherosclerotic changes at the origin of the SMA without high-grade stenosis. 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 Aneurysm measures 5 mm, partially wall calcified. Renals: - Right: Main right renal artery patent, small caliber. There are 2 accessory right renal arteries, both small caliber arising from the 9 o'clock and 10 o'clock position of the aorta just above the main right renal artery. - Left: Main left renal artery with mild atherosclerosis and no high-grade stenosis. Small caliber left renal artery. IMA: Inferior mesenteric artery is patent. Right lower extremity: Mild atherosclerosis of the right iliac system. No high-grade stenosis or occlusion. Hypogastric artery is patent. Right common femoral artery sheath. No evidence of local hematoma aneurysm/pseudoaneurysm. No significant hemorrhage adjacent to the right iliac artery. Proximal profunda femoris and SFA patent Left lower extremity: Mild atherosclerosis of the left iliac system. No high-grade stenosis or occlusion. Hypogastric artery is patent. External iliac artery patent. Minimal atherosclerosis of the left common femoral artery. Proximal profunda femoris and SFA patent. Veins: Right common femoral vein sheath terminates in the iliac system. Review of the MIP images confirms the above findings. NON-VASCULAR Hepatobiliary: Intermediate density fluid surrounding the liver, overlying right liver and the gallbladder. No evidence of extravasation of contrast that was identified active extravasation. Stranding overlying the left liver, in the region of the percutaneous  pericardial drain. Small volume of intermediate density fluid adjacent to the left liver. Pancreas: Unremarkable. Spleen: Punctate calcifications of spleen parenchyma. Adrenals/Urinary Tract: - Right adrenal gland: Unremarkable - Left adrenal gland: Unremarkable. - Right kidney: Persistent nephrogram of the right kidney on the noncontrast CT. Retained contrast within the calices. Cortical thinning. - Left Kidney: Persistent nephrogram of the left kidney. Contrast within the calices. Cortical thinning - Urinary Bladder: Contrast within the urinary bladder. Stomach/Bowel: - Stomach: Unremarkable. - Small bowel: Unremarkable - Appendix: Appendix is not visualized, however, no inflammatory changes are present adjacent to the cecum to indicate an appendicitis. - Colon: Moderate stool burden. Diverticular disease. No focal inflammatory changes. Lymphatic: No adenopathy. Mesenteric: High density fluid, small volume within the right pelvis. Reproductive: Unremarkable prostate. Other: No hernia. Musculoskeletal: Multilevel degenerative changes of the visualized spine. Surgical changes of laminectomy spanning L3-L5. Greatest degree of canal narrowing in the thoracic region at the disc space of T9-T10 secondary to posterior disc osteophyte complex and facet spurring posteriorly. No acute displaced fracture. Mild degenerative changes of the hips. IMPRESSION: Hemoperitoneum, with the largest volume of blood adjacent to the right liver and the gallbladder. There is no evidence of active extravasation. There is also a smaller volume of blood products adjacent to the percutaneous pericardial drain and overlying the left liver lobe. Small volume of right pelvic hemoperitoneum, likely redistributed from the upper abdomen, as there is no evidence of vascular injury of the femoral or iliac vasculature. Small residual hemopericardium, anteriorly,  with percutaneous pericardial drain in position. The course of the pericardial drain is  immediately adjacent to a vascular structure at the base of the heart, potentially posterior descending artery with decreased attenuation on the CT. This may be artifact or possibly compression by the presence of the drain. Native coronary artery disease with patent right coronary artery stent system. There is a questionable focus of extraluminal contrast adjacent to the left anterior descending coronary artery, and extravasation cannot be excluded based on the imaging findings. This detail was shared with Dr. Martinique upon initial preliminary review. The above preliminary results were discussed via telephone at the time of interpretation on 04/02/2020 at 4:40 pm with Dr. Martinique. Minimal ground-glass attenuation at the periphery of the lung apices with the differential including early edema, contusion/aspiration, and/or inflammatory/infectious change. Persisting nephrogram of the bilateral kidneys on the noncontrast phase, suggesting acute renal injury. 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 Aortic Atherosclerosis (ICD10-I70.0). Additional ancillary findings as above. Signed, Dulcy Fanny. Dellia Nims, RPVI [facility] Radiology Electronically Signed   By: Corrie Mckusick D.O.   On: 04/06/2020 16:57   CARDIAC CATHETERIZATION  Result Date: 04/12/2020 Successful pericardiocentesis for acute cardiac tamponade related to wire perforation during attempted CTO PCI of the LAD today. Plan: leave pericardial drain in place. Observe in the unit. Monitor drain output. Plan repeat Echo in the am.   CARDIAC CATHETERIZATION  Result Date: 03/24/2020  Mid LAD-1 lesion is 30% stenosed.  Mid LAD-2 lesion is 100% stenosed.  Mid LAD to Dist LAD lesion is 50% stenosed.  Mid RCA to Dist RCA lesion is 80% stenosed.  Balloon angioplasty was performed.  Ost RCA to Prox RCA lesion is  90% stenosed.  Balloon angioplasty was performed.  Ramus lesion is 80% stenosed.  Ost RCA lesion is 90% stenosed.  RPDA lesion is 100% stenosed.  Non-stenotic Ost Cx to Prox Cx lesion.  A drug-eluting stent was successfully placed using a SYNERGY XD 3.50X48.  Non-stenotic Prox RCA to Mid RCA lesion.  Post intervention, there is a 0% residual stenosis.  Post intervention, there is a 0% residual stenosis.  A drug-eluting stent was successfully placed using a SYNERGY XD 4.0X32.  Post intervention, there is a 0% residual stenosis.  Post intervention, there is a 0% residual stenosis.  Post intervention, there is a 100% residual stenosis.  1. Successful PCI of the ostium to distal RCA with rotational atherectomy and stenting with overlapping DES x 2. 2. Unsuccessful PCI of the CTO of the mid LAD due to inability to cross with a wire. The wire went subintimal and resulted in a small microperforation. Patient was hemodynamically stable throughout. Minimal effusion noted on Echo. Plan: continue DAPT for one year. Anticipate DC in am if stable. I would treat the disease in the LAD medically. This is supplied by left to left and right to left collaterals and now collateral flow from the RCA is improved. I would not attempt CTO PCI of the LAD again.   ECHOCARDIOGRAM COMPLETE  Result Date: 03/21/2020    ECHOCARDIOGRAM REPORT   Patient Name:   BOSTYN BOGIE Date of Exam: 03/24/2020 Medical Rec #:  [id number]    Height:       68.0 in Accession #:    [id number]   Weight:       145.0 lb Date of Birth:  [birth year]   BSA:  1.783 m Patient Age:    84 years     BP:           120/54 mmHg Patient Gender: M            HR:           71 bpm. Exam Location:  Inpatient Procedure: Limited Echo and Limited Color Doppler                       STAT ECHO Reported to: Dr Jenkins Rouge on 03/18/2020 12:09:00 PM. Indications:    I31.3 Pericardial effusion  History:        Patient has prior history of Echocardiogram examinations,  most                 recent 02/23/2020. Stroke, Arrythmias:RBBB; Risk                 Factors:Hypertension, Dyslipidemia and GERD.  Sonographer:    Jonelle Sidle Dance Referring Phys: Sheppard Coil VARANASI IMPRESSIONS  1. Left ventricular ejection fraction, by estimation, is not assessed%. The left ventricle has inadequate function. Left ventricular endocardial border not optimally defined to evaluate regional wall motion. There is not assessed left ventricular hypertrophy. Left ventricular diastolic function could not be evaluated.  2. Right ventricular systolic function is normal. The right ventricular size is normal.  3. Trivial pericardial effusion anterior to the RV new since echo done 02/23/20 . The pericardial effusion is anterior to the right ventricle.  4. The mitral valve was not assessed. N/A mitral valve regurgitation.  5. Tricuspid valve regurgitation N/A.  6. The aortic valve is normal in structure. Aortic valve regurgitation N/A.  7. Pulmonic valve regurgitation N/A. FINDINGS  Left Ventricle: Left ventricular ejection fraction, by estimation, is not assessed%. The left ventricle has inadequate function. Left ventricular endocardial border not optimally defined to evaluate regional wall motion. The left ventricular internal cavity size was normal in size. There is not assessed left ventricular hypertrophy. Left ventricular diastolic function could not be evaluated. Right Ventricle: The right ventricular size is normal. No increase in right ventricular wall thickness. Right ventricular systolic function is normal. Left Atrium: Left atrial size was not well visualized. Right Atrium: Right atrial size was not well visualized. Pericardium: Trivial pericardial effusion anterior to the RV new since echo done 02/23/20. Trivial pericardial effusion is present. The pericardial effusion is anterior to the right ventricle. Mitral Valve: The mitral valve was not assessed. N/A mitral valve regurgitation. Tricuspid Valve:  The tricuspid valve is not assessed. Tricuspid valve regurgitation N/A. Aortic Valve: The aortic valve is normal in structure. Aortic valve regurgitation N/A. Pulmonic Valve: The pulmonic valve was not assessed. Pulmonic valve regurgitation N/A. Aorta: The aortic root was not well visualized. IAS/Shunts: The interatrial septum was not assessed. Jenkins Rouge MD Electronically signed by Jenkins Rouge MD Signature Date/Time: 03/28/2020/12:18:49 PM    Final    CT Angio Chest/Abd/Pel for Dissection W and/or W/WO  Result Date: 04/09/2020 CLINICAL DATA:  84 year old male with a history of recent coronary intervention. EXAM: CT ANGIOGRAPHY CHEST, ABDOMEN AND PELVIS TECHNIQUE: Non-contrast CT of the chest was initially obtained. Multidetector CT imaging through the chest, abdomen and pelvis was performed using the standard protocol during bolus administration of intravenous contrast. Multiplanar reconstructed images and MIPs were obtained and reviewed to evaluate the vascular anatomy. CONTRAST:  72mL OMNIPAQUE IOHEXOL 350 MG/ML SOLN COMPARISON:  None. FINDINGS: CTA CHEST FINDINGS Cardiovascular: Heart: Pericardial drain from subxiphoid approach,  terminates in the pericardial space, inferiorly. Small volume of gas anterior pericardial space adjacent to small volume residual hemopericardium. Note that the course of the pericardial drain crosses a branch coronary artery on image 118 of series 10, potentially contributing to some compression as there is decreased attenuation within the artery branch at this site. Right coronary: Patent stent system in the origin of the right coronary artery. Native atherosclerotic disease of the right coronary artery. Main coronary artery: Patent. Native atherosclerotic calcifications of the left anterior descending coronary artery. Small punctate density adjacent to the LAD on image 87 of series 10, which is not definitely present on the pre contrast, potentially extravasation of contrast  versus artifact on this non gated study. Circumflex coronary artery: Atherosclerotic changes. Aorta: No significant valve calcifications. Diameter of the ascending aorta measures 41 mm. Patent branch vessels. Three vessel arch. Type 3 arch. Mild atherosclerosis of the descending thoracic aorta. No dissection or periaortic fluid. Pulmonary arteries: Timing of the contrast bolus not optimized for the pulmonary arteries. Main pulmonary artery measures 20 mm. No proximal filling defects. Mediastinum/Nodes: No mediastinal adenopathy. Unremarkable appearance of the thoracic esophagus. Unremarkable appearance of the thoracic inlet Lungs/Pleura: No pneumothorax. No pleural effusion. Peripheral ground-glass opacification in the apices of the bilateral lungs, without interlobular septal thickening. No comparison. Linear opacities at the bilateral lung bases. CTA ABDOMEN AND PELVIS FINDINGS VASCULAR Aorta: Atherosclerotic changes of the abdominal aorta. No dissection or aneurysm. No periaortic fluid. Celiac: Celiac artery patent with mild atherosclerosis. There is poor distal opacification of the splenic artery and the hepatic arteries. SMA: Atherosclerotic changes at the origin of the SMA without high-grade stenosis. 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 Aneurysm measures 5 mm, partially wall calcified. Renals: - Right: Main right renal artery patent, small caliber. There are 2 accessory right renal arteries, both small caliber arising from the 9 o'clock and 10 o'clock position of the aorta just above the main right renal artery. - Left: Main left renal artery with mild atherosclerosis and no high-grade stenosis. Small caliber left renal artery. IMA: Inferior mesenteric artery is patent. Right lower extremity: Mild atherosclerosis of the right iliac system. No high-grade stenosis or occlusion. Hypogastric artery is patent. Right common femoral artery sheath. No  evidence of local hematoma aneurysm/pseudoaneurysm. No significant hemorrhage adjacent to the right iliac artery. Proximal profunda femoris and SFA patent Left lower extremity: Mild atherosclerosis of the left iliac system. No high-grade stenosis or occlusion. Hypogastric artery is patent. External iliac artery patent. Minimal atherosclerosis of the left common femoral artery. Proximal profunda femoris and SFA patent. Veins: Right common femoral vein sheath terminates in the iliac system. Review of the MIP images confirms the above findings. NON-VASCULAR Hepatobiliary: Intermediate density fluid surrounding the liver, overlying right liver and the gallbladder. No evidence of extravasation of contrast that was identified active extravasation. Stranding overlying the left liver, in the region of the percutaneous pericardial drain. Small volume of intermediate density fluid adjacent to the left liver. Pancreas: Unremarkable. Spleen: Punctate calcifications of spleen parenchyma. Adrenals/Urinary Tract: - Right adrenal gland: Unremarkable - Left adrenal gland: Unremarkable. - Right kidney: Persistent nephrogram of the right kidney on the noncontrast CT. Retained contrast within the calices. Cortical thinning. - Left Kidney: Persistent nephrogram of the left kidney. Contrast within the calices. Cortical thinning - Urinary Bladder: Contrast within the urinary bladder. Stomach/Bowel: - Stomach: Unremarkable. - Small bowel: Unremarkable - Appendix: Appendix is not visualized, however, no  inflammatory changes are present adjacent to the cecum to indicate an appendicitis. - Colon: Moderate stool burden. Diverticular disease. No focal inflammatory changes. Lymphatic: No adenopathy. Mesenteric: High density fluid, small volume within the right pelvis. Reproductive: Unremarkable prostate. Other: No hernia. Musculoskeletal: Multilevel degenerative changes of the visualized spine. Surgical changes of laminectomy spanning L3-L5.  Greatest degree of canal narrowing in the thoracic region at the disc space of T9-T10 secondary to posterior disc osteophyte complex and facet spurring posteriorly. No acute displaced fracture. Mild degenerative changes of the hips. IMPRESSION: Hemoperitoneum, with the largest volume of blood adjacent to the right liver and the gallbladder. There is no evidence of active extravasation. There is also a smaller volume of blood products adjacent to the percutaneous pericardial drain and overlying the left liver lobe. Small volume of right pelvic hemoperitoneum, likely redistributed from the upper abdomen, as there is no evidence of vascular injury of the femoral or iliac vasculature. Small residual hemopericardium, anteriorly, with percutaneous pericardial drain in position. The course of the pericardial drain is immediately adjacent to a vascular structure at the base of the heart, potentially posterior descending artery with decreased attenuation on the CT. This may be artifact or possibly compression by the presence of the drain. Native coronary artery disease with patent right coronary artery stent system. There is a questionable focus of extraluminal contrast adjacent to the left anterior descending coronary artery, and extravasation cannot be excluded based on the imaging findings. This detail was shared with Dr. Martinique upon initial preliminary review. The above preliminary results were discussed via telephone at the time of interpretation on 04/09/2020 at 4:40 pm with Dr. Martinique. Minimal ground-glass attenuation at the periphery of the lung apices with the differential including early edema, contusion/aspiration, and/or inflammatory/infectious change. Persisting nephrogram of the bilateral kidneys on the noncontrast phase, suggesting acute renal injury. 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 Aortic  Atherosclerosis (ICD10-I70.0). Additional ancillary findings as above. Signed, Dulcy Fanny. Dellia Nims, RPVI [facility] Radiology Electronically Signed   By: Corrie Mckusick D.O.   On: 03/27/2020 16:57   ECHOCARDIOGRAM LIMITED  Result Date: 03/29/2020    ECHOCARDIOGRAM LIMITED REPORT   Patient Name:   Peyton Cardenas Date of Exam: 03/21/2020 Medical Rec #:  [id number]    Height:       68.0 in Accession #:    [id number]   Weight:       145.0 lb Date of Birth:  June 07, 1936   BSA:          1.783 m Patient Age:    89 years     BP:           72/53 mmHg Patient Gender: M            HR:           75 bpm. Exam Location:  Inpatient Procedure: Limited Echo STAT ECHO Indications:    Pericardial Effusion  History:        Patient has prior history of Echocardiogram examinations, most                 recent 04/04/2020. CAD; Risk Factors:Dyslipidemia.  Sonographer:    Mikki Santee RDCS (AE) Referring Phys: Sherren Mocha MD IMPRESSIONS  1. Post pericardiocentesis with significant decrease in size of effusion and improved RV cavity expansion Still with small persistent effusion over RV free wall Apparently pig tail  catheter left in pericardial space. FINDINGS  Pericardium: Post pericardiocentesis with significant decrease in size of effusion and improved RV cavity expansion Still with small persistent effusion over RV free wall Apparently pig tail catheter left in pericardial space. Jenkins Rouge MD Electronically signed by Jenkins Rouge MD Signature Date/Time: 03/23/2020/3:22:06 PM    Final    ECHOCARDIOGRAM LIMITED  Result Date: 03/16/2020    ECHOCARDIOGRAM LIMITED REPORT   Patient Name:   Amiel Suchecki Date of Exam: 03/22/2020 Medical Rec #:  [id number]    Height:       68.0 in Accession #:    [id number]   Weight:       145.0 lb Date of Birth:  01-Jan-1936   BSA:          1.783 m Patient Age:    52 years     BP:           75/51 mmHg Patient Gender: M            HR:           78 bpm.  Exam Location:  Inpatient Procedure: Limited Echo Indications:    Pericardial effusion  History:        Patient has prior history of Echocardiogram examinations, most                 recent 03/27/2020. Pericardial Disease, CAD and Previous                 Myocardial Infarction, Stroke, Arrythmias:RBBB,                 Signs/Symptoms:Shortness of Breath and Chest Pain; Risk                 Factors:Hypertension and Dyslipidemia.  Sonographer:    Dustin Flock Referring Phys: Sherren Mocha IMPRESSIONS  1. Post CTO intervention Marked increase in pericardial effusion compared to earlier echo today with tamponade. Patient is already in cath lab with Dr Martinique for pericardiocentesis. FINDINGS  Pericardium: Post CTO intervention Marked increase in pericardial effusion compared to earlier echo today with tamponade. Patient is already in cath lab with Dr Martinique for pericardiocentesis. Jenkins Rouge MD Electronically signed by Jenkins Rouge MD Signature Date/Time: 04/03/2020/1:38:33 PM    Final     Anti-infectives: Anti-infectives (From admission, onward)   None      Assessment/Plan: hgb 8.8 this AM, last product was cryo at 11pm. It seems that any active hemorrhage has self-resolved; his hgb never fell below 7. I do not think there is any benefit to an angiogram or surgical exploration given the trend in his hgb. Despite excellent and timely resuscitation by the cardiology/ critical care team, he has continued multi-system organ failure and refractory acidosis despite maximum therapies. Bedside discussion with Dr Burt Knack, Marni Griffon, and patient's family. The family wishes to transition comfort care and the medical team is supportive of this.    LOS: 0 days    Clovis Riley 16-Apr-2020

## 2020-04-13 NOTE — Progress Notes (Signed)
Progress Note  Patient Name: Aquarius Latouche Date of Encounter: 04/27/2020  [facility] Cardiologist: Candee Furbish, MD   Subjective   Intubated, sedated. Discussed with patient's wife and daughter at the bedside.   Inpatient Medications    Scheduled Meds: . sodium chloride   Intravenous Once  . vitamin C  250 mg Per Tube Daily  . aspirin  81 mg Per Tube Daily  . chlorhexidine gluconate (MEDLINE KIT)  15 mL Mouth Rinse BID  . Chlorhexidine Gluconate Cloth  6 each Topical Daily  . cholecalciferol  1,000 Units Per Tube Daily  . docusate  100 mg Per Tube BID  . DULoxetine  20 mg Oral Daily  . mouth rinse  15 mL Mouth Rinse 10 times per day  . multivitamin with minerals  1 tablet Per Tube Daily  . pantoprazole (PROTONIX) IV  40 mg Intravenous QHS  . protamine  25 mg Intravenous Once  . rosuvastatin  40 mg Per Tube Daily  . sodium chloride flush  3 mL Intravenous Q12H   Continuous Infusions: . sodium chloride    . DOPamine Stopped (2020-04-27 0023)  . epinephrine 9 mcg/min (2020/04/27 0735)  . fentaNYL infusion INTRAVENOUS 50 mcg/hr (Apr 27, 2020 0700)  . lactated ringers    . norepinephrine (LEVOPHED) Adult infusion 50 mcg/min (2020-04-27 0710)  . sodium chloride 999 mL/hr at 04/10/2020 1257  . vasopressin 0.04 Units/min (2020-04-27 0700)   PRN Meds: sodium chloride, acetaminophen (TYLENOL) oral liquid 160 mg/5 mL, cyclobenzaprine, fentaNYL (SUBLIMAZE) injection, midazolam, nitroGLYCERIN, ondansetron (ZOFRAN) IV, polyethylene glycol, sodium chloride flush   Vital Signs    Vitals:   04-27-2020 0400 04-27-2020 0500 April 27, 2020 0600 Apr 27, 2020 0700  BP: (!) 92/56 114/60 (!) 84/61 (!) 74/45  Pulse: (!) 115 (!) 117 (!) 122 (!) 124  Resp: (!) 25 (!) 28 (!) 32 (!) 35  Temp: 98.1 F (36.7 C) 98.6 F (37 C) 99 F (37.2 C) 98.4 F (36.9 C)  TempSrc: Esophageal     SpO2: 95% 99% 100% 100%  Weight:      Height:        Intake/Output Summary (Last 24 hours) at 2020/04/27 0811 Last data filed at  04-27-2020 0700 Gross per 24 hour  Intake 3722.96 ml  Output 820 ml  Net 2902.96 ml   Last 3 Weights 03/21/2020 03/07/2020 02/24/2020  Weight (lbs) 145 lb 151 lb 6.4 oz 146 lb 1.6 oz  Weight (kg) 65.772 kg 68.675 kg 66.271 kg      Telemetry    Sinus rhythm, sinus tachycardia - Personally Reviewed  ECG    Sinus tachycardia, RBBB, LAFB - Personally Reviewed  Physical Exam  Intubated, sedated, ill-appearing GEN: No acute distress.   Neck: No JVD Cardiac: tachycardic and regular, no murmurs, rubs, or gallops.  Respiratory: diffuse rales/rhonchi GI: distended MS: diffuse edema; No deformity. Neuro:  Nonfocal  Psych: intubated, sedated  Labs    High Sensitivity Troponin:  No results for input(s): TROPONINIHS in the last 720 hours.    Chemistry Recent Labs  Lab 03/25/20 1014 03/25/20 1014 03/20/2020 1530 03/24/2020 1810 2020/04/27 0116 04/27/2020 0400 27-Apr-2020 0416  NA 133*  --  130*   < > 144 145 145  K 3.8   < > 3.9   < > 4.1 3.6 3.4*  CL 94*  --  102  --   --  109  --   CO2 26  --  15*  --   --  9*  --   GLUCOSE 99  --  243*  --   --  114*  --   BUN 13  --  11  --   --  13  --   CREATININE 1.04  --  1.06  --   --  1.73*  --   CALCIUM 9.8  --  7.8*  --   --  7.2*  --   PROT 6.6  --   --   --   --   --   --   ALBUMIN 4.7*  --   --   --   --   --   --   AST 23  --   --   --   --   --   --   ALT 20  --   --   --   --   --   --   ALKPHOS 98  --   --   --   --   --   --   BILITOT 0.5  --   --   --   --   --   --   GFRNONAA 67  --  >60  --   --  36*  --   GFRAA 77  --  >60  --   --  42*  --   ANIONGAP  --   --  13  --   --  27*  --    < > = values in this interval not displayed.     Hematology Recent Labs  Lab 03/16/2020 2215 03/20/2020 2218 04/30/20 0122 Apr 30, 2020 0400 April 30, 2020 0416  WBC 13.1*  --  11.8* 10.0  --   RBC 4.01*  --  3.08* 2.92*  --   HGB 11.9*   < > 9.3* 8.8* 7.1*  HCT 37.0*   < > 28.6* 26.8* 21.0*  MCV 92.3  --  92.9 91.8  --   MCH 29.7  --  30.2 30.1   --   MCHC 32.2  --  32.5 32.8  --   RDW 13.9  --  14.3 14.6  --   PLT 76*  --  65* 64*  --    < > = values in this interval not displayed.    BNPNo results for input(s): BNP, PROBNP in the last 168 hours.   DDimer  Recent Labs  Lab 03/22/2020 1734  DDIMER 0.45     Radiology    CT CHEST WO CONTRAST  Result Date: 04/07/2020 CLINICAL DATA:  84 year old male with a history of recent coronary intervention. EXAM: CT ANGIOGRAPHY CHEST, ABDOMEN AND PELVIS TECHNIQUE: Non-contrast CT of the chest was initially obtained. Multidetector CT imaging through the chest, abdomen and pelvis was performed using the standard protocol during bolus administration of intravenous contrast. Multiplanar reconstructed images and MIPs were obtained and reviewed to evaluate the vascular anatomy. CONTRAST:  78m OMNIPAQUE IOHEXOL 350 MG/ML SOLN COMPARISON:  None. FINDINGS: CTA CHEST FINDINGS Cardiovascular: Heart: Pericardial drain from subxiphoid approach, terminates in the pericardial space, inferiorly. Small volume of gas anterior pericardial space adjacent to small volume residual hemopericardium. Note that the course of the pericardial drain crosses a branch coronary artery on image 118 of series 10, potentially contributing to some compression as there is decreased attenuation within the artery branch at this site. Right coronary: Patent stent system in the origin of the right coronary artery. Native atherosclerotic disease of the right coronary artery. Main coronary artery: Patent. Native atherosclerotic calcifications of the left anterior descending coronary artery. Small punctate  density adjacent to the LAD on image 87 of series 10, which is not definitely present on the pre contrast, potentially extravasation of contrast versus artifact on this non gated study. Circumflex coronary artery: Atherosclerotic changes. Aorta: No significant valve calcifications. Diameter of the ascending aorta measures 41 mm. Patent branch  vessels. Three vessel arch. Type 3 arch. Mild atherosclerosis of the descending thoracic aorta. No dissection or periaortic fluid. Pulmonary arteries: Timing of the contrast bolus not optimized for the pulmonary arteries. Main pulmonary artery measures 20 mm. No proximal filling defects. Mediastinum/Nodes: No mediastinal adenopathy. Unremarkable appearance of the thoracic esophagus. Unremarkable appearance of the thoracic inlet Lungs/Pleura: No pneumothorax. No pleural effusion. Peripheral ground-glass opacification in the apices of the bilateral lungs, without interlobular septal thickening. No comparison. Linear opacities at the bilateral lung bases. CTA ABDOMEN AND PELVIS FINDINGS VASCULAR Aorta: Atherosclerotic changes of the abdominal aorta. No dissection or aneurysm. No periaortic fluid. Celiac: Celiac artery patent with mild atherosclerosis. There is poor distal opacification of the splenic artery and the hepatic arteries. SMA: Atherosclerotic changes at the origin of the SMA without high-grade stenosis. 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 Aneurysm measures 5 mm, partially wall calcified. Renals: - Right: Main right renal artery patent, small caliber. There are 2 accessory right renal arteries, both small caliber arising from the 9 o'clock and 10 o'clock position of the aorta just above the main right renal artery. - Left: Main left renal artery with mild atherosclerosis and no high-grade stenosis. Small caliber left renal artery. IMA: Inferior mesenteric artery is patent. Right lower extremity: Mild atherosclerosis of the right iliac system. No high-grade stenosis or occlusion. Hypogastric artery is patent. Right common femoral artery sheath. No evidence of local hematoma aneurysm/pseudoaneurysm. No significant hemorrhage adjacent to the right iliac artery. Proximal profunda femoris and SFA patent Left lower extremity: Mild atherosclerosis of  the left iliac system. No high-grade stenosis or occlusion. Hypogastric artery is patent. External iliac artery patent. Minimal atherosclerosis of the left common femoral artery. Proximal profunda femoris and SFA patent. Veins: Right common femoral vein sheath terminates in the iliac system. Review of the MIP images confirms the above findings. NON-VASCULAR Hepatobiliary: Intermediate density fluid surrounding the liver, overlying right liver and the gallbladder. No evidence of extravasation of contrast that was identified active extravasation. Stranding overlying the left liver, in the region of the percutaneous pericardial drain. Small volume of intermediate density fluid adjacent to the left liver. Pancreas: Unremarkable. Spleen: Punctate calcifications of spleen parenchyma. Adrenals/Urinary Tract: - Right adrenal gland: Unremarkable - Left adrenal gland: Unremarkable. - Right kidney: Persistent nephrogram of the right kidney on the noncontrast CT. Retained contrast within the calices. Cortical thinning. - Left Kidney: Persistent nephrogram of the left kidney. Contrast within the calices. Cortical thinning - Urinary Bladder: Contrast within the urinary bladder. Stomach/Bowel: - Stomach: Unremarkable. - Small bowel: Unremarkable - Appendix: Appendix is not visualized, however, no inflammatory changes are present adjacent to the cecum to indicate an appendicitis. - Colon: Moderate stool burden. Diverticular disease. No focal inflammatory changes. Lymphatic: No adenopathy. Mesenteric: High density fluid, small volume within the right pelvis. Reproductive: Unremarkable prostate. Other: No hernia. Musculoskeletal: Multilevel degenerative changes of the visualized spine. Surgical changes of laminectomy spanning L3-L5. Greatest degree of canal narrowing in the thoracic region at the disc space of T9-T10 secondary to posterior disc osteophyte complex and facet spurring posteriorly. No acute displaced fracture. Mild  degenerative changes of the hips.  IMPRESSION: Hemoperitoneum, with the largest volume of blood adjacent to the right liver and the gallbladder. There is no evidence of active extravasation. There is also a smaller volume of blood products adjacent to the percutaneous pericardial drain and overlying the left liver lobe. Small volume of right pelvic hemoperitoneum, likely redistributed from the upper abdomen, as there is no evidence of vascular injury of the femoral or iliac vasculature. Small residual hemopericardium, anteriorly, with percutaneous pericardial drain in position. The course of the pericardial drain is immediately adjacent to a vascular structure at the base of the heart, potentially posterior descending artery with decreased attenuation on the CT. This may be artifact or possibly compression by the presence of the drain. Native coronary artery disease with patent right coronary artery stent system. There is a questionable focus of extraluminal contrast adjacent to the left anterior descending coronary artery, and extravasation cannot be excluded based on the imaging findings. This detail was shared with Dr. Martinique upon initial preliminary review. The above preliminary results were discussed via telephone at the time of interpretation on 03/29/2020 at 4:40 pm with Dr. Martinique. Minimal ground-glass attenuation at the periphery of the lung apices with the differential including early edema, contusion/aspiration, and/or inflammatory/infectious change. Persisting nephrogram of the bilateral kidneys on the noncontrast phase, suggesting acute renal injury. 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 Aortic Atherosclerosis (ICD10-I70.0). Additional ancillary findings as above. Signed, Dulcy Fanny. Dellia Nims, RPVI [facility] Radiology Electronically Signed   By:  Corrie Mckusick D.O.   On: 04/11/2020 16:57   CARDIAC CATHETERIZATION  Result Date: 03/23/2020 Successful pericardiocentesis for acute cardiac tamponade related to wire perforation during attempted CTO PCI of the LAD today. Plan: leave pericardial drain in place. Observe in the unit. Monitor drain output. Plan repeat Echo in the am.   CARDIAC CATHETERIZATION  Result Date: 03/19/2020  Mid LAD-1 lesion is 30% stenosed.  Mid LAD-2 lesion is 100% stenosed.  Mid LAD to Dist LAD lesion is 50% stenosed.  Mid RCA to Dist RCA lesion is 80% stenosed.  Balloon angioplasty was performed.  Ost RCA to Prox RCA lesion is 90% stenosed.  Balloon angioplasty was performed.  Ramus lesion is 80% stenosed.  Ost RCA lesion is 90% stenosed.  RPDA lesion is 100% stenosed.  Non-stenotic Ost Cx to Prox Cx lesion.  A drug-eluting stent was successfully placed using a SYNERGY XD 3.50X48.  Non-stenotic Prox RCA to Mid RCA lesion.  Post intervention, there is a 0% residual stenosis.  Post intervention, there is a 0% residual stenosis.  A drug-eluting stent was successfully placed using a SYNERGY XD 4.0X32.  Post intervention, there is a 0% residual stenosis.  Post intervention, there is a 0% residual stenosis.  Post intervention, there is a 100% residual stenosis.  1. Successful PCI of the ostium to distal RCA with rotational atherectomy and stenting with overlapping DES x 2. 2. Unsuccessful PCI of the CTO of the mid LAD due to inability to cross with a wire. The wire went subintimal and resulted in a small microperforation. Patient was hemodynamically stable throughout. Minimal effusion noted on Echo. Plan: continue DAPT for one year. Anticipate DC in am if stable. I would treat the disease in the LAD medically. This is supplied by left to left and right to left collaterals and now collateral flow from the RCA is improved. I would not attempt CTO PCI of  the LAD again.   ECHOCARDIOGRAM COMPLETE  Result Date:  03/23/2020    ECHOCARDIOGRAM REPORT   Patient Name:   JERRIK HOUSHOLDER Date of Exam: 03/24/2020 Medical Rec #:  [id number]    Height:       68.0 in Accession #:    [id number]   Weight:       145.0 lb Date of Birth:  [birth year]   BSA:          1.783 m Patient Age:    89 years     BP:           120/54 mmHg Patient Gender: M            HR:           71 bpm. Exam Location:  Inpatient Procedure: Limited Echo and Limited Color Doppler                       STAT ECHO Reported to: Dr Jenkins Rouge on 04/04/2020 12:09:00 PM. Indications:    I31.3 Pericardial effusion  History:        Patient has prior history of Echocardiogram examinations, most                 recent 02/23/2020. Stroke, Arrythmias:RBBB; Risk                 Factors:Hypertension, Dyslipidemia and GERD.  Sonographer:    Jonelle Sidle Dance Referring Phys: Sheppard Coil VARANASI IMPRESSIONS  1. Left ventricular ejection fraction, by estimation, is not assessed%. The left ventricle has inadequate function. Left ventricular endocardial border not optimally defined to evaluate regional wall motion. There is not assessed left ventricular hypertrophy. Left ventricular diastolic function could not be evaluated.  2. Right ventricular systolic function is normal. The right ventricular size is normal.  3. Trivial pericardial effusion anterior to the RV new since echo done 02/23/20 . The pericardial effusion is anterior to the right ventricle.  4. The mitral valve was not assessed. N/A mitral valve regurgitation.  5. Tricuspid valve regurgitation N/A.  6. The aortic valve is normal in structure. Aortic valve regurgitation N/A.  7. Pulmonic valve regurgitation N/A. FINDINGS  Left Ventricle: Left ventricular ejection fraction, by estimation, is not assessed%. The left ventricle has inadequate function. Left ventricular endocardial border not optimally defined to evaluate regional wall motion. The left ventricular internal cavity size was normal in size. There is not assessed left  ventricular hypertrophy. Left ventricular diastolic function could not be evaluated. Right Ventricle: The right ventricular size is normal. No increase in right ventricular wall thickness. Right ventricular systolic function is normal. Left Atrium: Left atrial size was not well visualized. Right Atrium: Right atrial size was not well visualized. Pericardium: Trivial pericardial effusion anterior to the RV new since echo done 02/23/20. Trivial pericardial effusion is present. The pericardial effusion is anterior to the right ventricle. Mitral Valve: The mitral valve was not assessed. N/A mitral valve regurgitation. Tricuspid Valve: The tricuspid valve is not assessed. Tricuspid valve regurgitation N/A. Aortic Valve: The aortic valve is normal in structure. Aortic valve regurgitation N/A. Pulmonic Valve: The pulmonic valve was not assessed. Pulmonic valve regurgitation N/A. Aorta: The aortic root was not well visualized. IAS/Shunts: The interatrial septum was not assessed. Jenkins Rouge MD Electronically signed by Jenkins Rouge MD Signature Date/Time: 04/12/2020/12:18:49 PM    Final    CT Angio Chest/Abd/Pel for Dissection W and/or W/WO  Result Date: 04/08/2020 CLINICAL DATA:  84 year old male with  a history of recent coronary intervention. EXAM: CT ANGIOGRAPHY CHEST, ABDOMEN AND PELVIS TECHNIQUE: Non-contrast CT of the chest was initially obtained. Multidetector CT imaging through the chest, abdomen and pelvis was performed using the standard protocol during bolus administration of intravenous contrast. Multiplanar reconstructed images and MIPs were obtained and reviewed to evaluate the vascular anatomy. CONTRAST:  68m OMNIPAQUE IOHEXOL 350 MG/ML SOLN COMPARISON:  None. FINDINGS: CTA CHEST FINDINGS Cardiovascular: Heart: Pericardial drain from subxiphoid approach, terminates in the pericardial space, inferiorly. Small volume of gas anterior pericardial space adjacent to small volume residual hemopericardium. Note  that the course of the pericardial drain crosses a branch coronary artery on image 118 of series 10, potentially contributing to some compression as there is decreased attenuation within the artery branch at this site. Right coronary: Patent stent system in the origin of the right coronary artery. Native atherosclerotic disease of the right coronary artery. Main coronary artery: Patent. Native atherosclerotic calcifications of the left anterior descending coronary artery. Small punctate density adjacent to the LAD on image 87 of series 10, which is not definitely present on the pre contrast, potentially extravasation of contrast versus artifact on this non gated study. Circumflex coronary artery: Atherosclerotic changes. Aorta: No significant valve calcifications. Diameter of the ascending aorta measures 41 mm. Patent branch vessels. Three vessel arch. Type 3 arch. Mild atherosclerosis of the descending thoracic aorta. No dissection or periaortic fluid. Pulmonary arteries: Timing of the contrast bolus not optimized for the pulmonary arteries. Main pulmonary artery measures 20 mm. No proximal filling defects. Mediastinum/Nodes: No mediastinal adenopathy. Unremarkable appearance of the thoracic esophagus. Unremarkable appearance of the thoracic inlet Lungs/Pleura: No pneumothorax. No pleural effusion. Peripheral ground-glass opacification in the apices of the bilateral lungs, without interlobular septal thickening. No comparison. Linear opacities at the bilateral lung bases. CTA ABDOMEN AND PELVIS FINDINGS VASCULAR Aorta: Atherosclerotic changes of the abdominal aorta. No dissection or aneurysm. No periaortic fluid. Celiac: Celiac artery patent with mild atherosclerosis. There is poor distal opacification of the splenic artery and the hepatic arteries. SMA: Atherosclerotic changes at the origin of the SMA without high-grade stenosis. 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 Aneurysm measures 5 mm, partially wall calcified. Renals: - Right: Main right renal artery patent, small caliber. There are 2 accessory right renal arteries, both small caliber arising from the 9 o'clock and 10 o'clock position of the aorta just above the main right renal artery. - Left: Main left renal artery with mild atherosclerosis and no high-grade stenosis. Small caliber left renal artery. IMA: Inferior mesenteric artery is patent. Right lower extremity: Mild atherosclerosis of the right iliac system. No high-grade stenosis or occlusion. Hypogastric artery is patent. Right common femoral artery sheath. No evidence of local hematoma aneurysm/pseudoaneurysm. No significant hemorrhage adjacent to the right iliac artery. Proximal profunda femoris and SFA patent Left lower extremity: Mild atherosclerosis of the left iliac system. No high-grade stenosis or occlusion. Hypogastric artery is patent. External iliac artery patent. Minimal atherosclerosis of the left common femoral artery. Proximal profunda femoris and SFA patent. Veins: Right common femoral vein sheath terminates in the iliac system. Review of the MIP images confirms the above findings. NON-VASCULAR Hepatobiliary: Intermediate density fluid surrounding the liver, overlying right liver and the gallbladder. No evidence of extravasation of contrast that was identified active extravasation. Stranding overlying the left liver, in the region of the percutaneous pericardial drain. Small volume of intermediate density fluid adjacent to the left liver.  Pancreas: Unremarkable. Spleen: Punctate calcifications of spleen parenchyma. Adrenals/Urinary Tract: - Right adrenal gland: Unremarkable - Left adrenal gland: Unremarkable. - Right kidney: Persistent nephrogram of the right kidney on the noncontrast CT. Retained contrast within the calices. Cortical thinning. - Left Kidney: Persistent nephrogram of the left kidney. Contrast within the calices.  Cortical thinning - Urinary Bladder: Contrast within the urinary bladder. Stomach/Bowel: - Stomach: Unremarkable. - Small bowel: Unremarkable - Appendix: Appendix is not visualized, however, no inflammatory changes are present adjacent to the cecum to indicate an appendicitis. - Colon: Moderate stool burden. Diverticular disease. No focal inflammatory changes. Lymphatic: No adenopathy. Mesenteric: High density fluid, small volume within the right pelvis. Reproductive: Unremarkable prostate. Other: No hernia. Musculoskeletal: Multilevel degenerative changes of the visualized spine. Surgical changes of laminectomy spanning L3-L5. Greatest degree of canal narrowing in the thoracic region at the disc space of T9-T10 secondary to posterior disc osteophyte complex and facet spurring posteriorly. No acute displaced fracture. Mild degenerative changes of the hips. IMPRESSION: Hemoperitoneum, with the largest volume of blood adjacent to the right liver and the gallbladder. There is no evidence of active extravasation. There is also a smaller volume of blood products adjacent to the percutaneous pericardial drain and overlying the left liver lobe. Small volume of right pelvic hemoperitoneum, likely redistributed from the upper abdomen, as there is no evidence of vascular injury of the femoral or iliac vasculature. Small residual hemopericardium, anteriorly, with percutaneous pericardial drain in position. The course of the pericardial drain is immediately adjacent to a vascular structure at the base of the heart, potentially posterior descending artery with decreased attenuation on the CT. This may be artifact or possibly compression by the presence of the drain. Native coronary artery disease with patent right coronary artery stent system. There is a questionable focus of extraluminal contrast adjacent to the left anterior descending coronary artery, and extravasation cannot be excluded based on the imaging findings. This  detail was shared with Dr. Martinique upon initial preliminary review. The above preliminary results were discussed via telephone at the time of interpretation on 03/18/2020 at 4:40 pm with Dr. Martinique. Minimal ground-glass attenuation at the periphery of the lung apices with the differential including early edema, contusion/aspiration, and/or inflammatory/infectious change. Persisting nephrogram of the bilateral kidneys on the noncontrast phase, suggesting acute renal injury. 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 Aortic Atherosclerosis (ICD10-I70.0). Additional ancillary findings as above. Signed, Dulcy Fanny. Dellia Nims, RPVI [facility] Electronically Signed   By: Corrie Mckusick D.O.   On: 03/24/2020 16:57   ECHOCARDIOGRAM LIMITED  Result Date: 03/18/2020    ECHOCARDIOGRAM LIMITED REPORT   Patient Name:   Cynthia Dombkowski Date of Exam: 03/21/2020 Medical Rec #:  [id number]    Height:       68.0 in Accession #:    [id number]   Weight:       145.0 lb Date of Birth:  06/19/1936   BSA:          1.783 m Patient Age:    74 years     BP:           72/53 mmHg Patient Gender: M            HR:           75 bpm. Exam Location:  Inpatient Procedure: Limited Echo STAT ECHO Indications:    Pericardial Effusion  History:  Patient has prior history of Echocardiogram examinations, most                 recent 03/20/2020. CAD; Risk Factors:Dyslipidemia.  Sonographer:    Mikki Santee RDCS (AE) Referring Phys: Sherren Mocha MD IMPRESSIONS  1. Post pericardiocentesis with significant decrease in size of effusion and improved RV cavity expansion Still with small persistent effusion over RV free wall Apparently pig tail catheter left in pericardial space. FINDINGS  Pericardium: Post pericardiocentesis with significant decrease in size of effusion and improved RV cavity  expansion Still with small persistent effusion over RV free wall Apparently pig tail catheter left in pericardial space. Jenkins Rouge MD Electronically signed by Jenkins Rouge MD Signature Date/Time: 03/27/2020/3:22:06 PM    Final    ECHOCARDIOGRAM LIMITED  Result Date: 04/03/2020    ECHOCARDIOGRAM LIMITED REPORT   Patient Name:   Adeel Weiskopf Date of Exam: 03/21/2020 Medical Rec #:  [id number]    Height:       68.0 in Accession #:    [id number]   Weight:       145.0 lb Date of Birth:  Dec 28, 1935   BSA:          1.783 m Patient Age:    8 years     BP:           75/51 mmHg Patient Gender: M            HR:           78 bpm. Exam Location:  Inpatient Procedure: Limited Echo Indications:    Pericardial effusion  History:        Patient has prior history of Echocardiogram examinations, most                 recent 03/13/2020. Pericardial Disease, CAD and Previous                 Myocardial Infarction, Stroke, Arrythmias:RBBB,                 Signs/Symptoms:Shortness of Breath and Chest Pain; Risk                 Factors:Hypertension and Dyslipidemia.  Sonographer:    Dustin Flock Referring Phys: Sherren Mocha IMPRESSIONS  1. Post CTO intervention Marked increase in pericardial effusion compared to earlier echo today with tamponade. Patient is already in cath lab with Dr Martinique for pericardiocentesis. FINDINGS  Pericardium: Post CTO intervention Marked increase in pericardial effusion compared to earlier echo today with tamponade. Patient is already in cath lab with Dr Martinique for pericardiocentesis. Jenkins Rouge MD Electronically signed by Jenkins Rouge MD Signature Date/Time: 04/03/2020/1:38:33 PM    Final     Cardiac Studies   See studies above. All have been reviewed personally.   Patient Profile     84 y.o. male with critical illness after coronary perforation, hemopericardium with tamponade, and intra-abdominal bleeding.  Assessment & Plan    1. Hemopericardium: s/p emergent pericardiocentesis. 285  cc blood drained over last 12 hours (95 cc overnight). Drain in place.  2. Severe hemorrhagic shock: has received extension blood transfusion. On epi, norepi, and vasopressin drips. Remains hypotensive. Lactate overnight > 11. Remains acidemic on bicarb infusion and full pressor support.  3. Acute blood loss anemia: as above. General surgery evaluation yesterday.  4. Acute oliguric kidney injury - secondary to problems above. 5. VDRF  Grim prognosis. 84 yo male with multiorgan failure, continued severe hypotension on 3 pressors, lactate >  11, ongoing acidemia. Family expresses wishes for comfort extubation. Discussed with Dr Windle Guard and CCM team. Met with family as a group. Will proceed with full comfort measures. Their Minister has arrived and is at the bedside with them.   For questions or updates, please contact [facility] Please consult [email or web address] for contact info under     Signed, Sherren Mocha, MD  04-21-20, 8:11 AM

## 2020-04-13 NOTE — Procedures (Signed)
Extubation Procedure Note  Patient Details:   Name: Christopher Ferguson DOB: 1936/08/06 MRN: [id number]   Airway Documentation:    Vent end date: 04/22/20 Vent end time: 0943   Evaluation Pt extubated to 2L [place] per terminal extubation orders. RN at bedside.  Vilinda Blanks 2020/04/22, 9:48 AM

## 2020-04-13 NOTE — Progress Notes (Signed)
NAMESoloman Ferguson, MRN:  [id number], DOB:  11-11-1935, LOS: 0 ADMISSION DATE:  04/06/2020, CONSULTATION DATE:  03/21/2020 REFERRING MD: Dr. Burt Knack, CHIEF COMPLAINT: Hypotension  Brief History   Christopher Ferguson is a 84 year old with past medical history of chronic back pain, CAD status post stents in 2001 in 2009, prior CVA, hypertension, hyperlipidemia, who was admitted to the ICU 8/18 for pressor support status post pericardial drain due to PCI perforation.   History of present illness   Christopher Ferguson is a 84 year old with past medical history of chronic back pain, CAD status post stents in 2001 in 2009, prior CVA, hypertension, hyperlipidemia, who was admitted for complete occlusion of LAD status post PCI.  Unsuccessful PCI of the CTO of the mid LAD which caused a small microperforation and resulted an enlarging pericardial effusion with evidence of tamponade on echo.  Pericardial drain was placed and collected approximately 450 cc of blood.  Repeat echo showed complete resolution of pericardial effusion.  However patient still has persistent hypotension which requires Levophed.  Patient was intubated and admitted to the ICU 8/18 for pressor support and ventilator management.   Past Medical History  Hypertension Hyperlipidemia Prior CVA CAD status post stents in 2001 in 2009 Chronic back pain  Significant Hospital Events   8/18 admitted to the ICU  Consults:  Cardiology Surgery PCCM  Procedures:  8/18 PCI to LAD, pericardial drain  Significant Diagnostic Tests:  8/18 CTA Hemoperitoneum, with the largest volume of blood adjacent to the right liver and the gallbladder. There is no evidence of active extravasation. There is also a smaller volume of blood products adjacent to the percutaneous pericardial drain and overlying the left liver lobe.  Small volume of right pelvic hemoperitoneum, likely redistributed from the upper abdomen, as there is no evidence of vascular injury of  the femoral or iliac vasculature.  Small residual hemopericardium, anteriorly, with percutaneous pericardial drain in position. The course of the pericardial drain is immediately adjacent to a vascular structure at the base of the heart, potentially posterior descending artery with decreased attenuation on the CT. This may be artifact or possibly compression by the presence of the drain.  Native coronary artery disease with patent right coronary artery stent system. There is a questionable focus of extraluminal contrast adjacent to the left anterior descending coronary artery, and extravasation cannot be excluded based on the imaging findings.   Minimal ground-glass attenuation at the periphery of the lung apices with the differential including early edema, contusion/aspiration, and/or inflammatory/infectious change.  Persisting nephrogram of the bilateral kidneys on the noncontrast phase, suggesting acute renal injury.  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  Micro Data:  N/A  Antimicrobials:  N/A  Interim history/subjective:  Remains on maximum pressors, still in refractory shock family at bedside indicating they like to transition to comfort  Objective   Blood pressure (Abnormal) 74/45, pulse (Abnormal) 117, temperature 98.1 F (36.7 C), resp. rate (Abnormal) 38, height 5\' 8"  (1.727 m), weight 65.8 kg, SpO2 100 %.    Vent Mode: PRVC FiO2 (%):  [40 %-100 %] 40 % Set Rate:  [16 bmp-30 bmp] 24 bmp Vt Set:  [540 mL] 540 mL PEEP:  [5 cmH20] 5 cmH20 Plateau Pressure:  [15 cmH20-25 cmH20] 25 cmH20   Intake/Output Summary (Last 24 hours) at 04/12/2020 0935 Last data filed at 04/12/2020 0700 Gross per 24 hour  Intake 3722.96 ml  Output  820 ml  Net 2902.96 ml   Filed Weights   03/22/2020 0654  Weight: 65.8 kg    Examination: General: 84 year old critically  ill white male currently minimally responsive on full ventilatory support and high dose vasoactive drip infusions  HEENT normocephalic atraumatic no jugular venous distention orally intubated  Pulmonary: Clear, diminished, tachypneic respiratory efforts  Cardiac regular rate rhythm  Abdomen soft  Neuro unresponsive  GU minimal urine output  Extremities cool and mottled   Resolved Hospital Problem list     Assessment & Plan:  Hypovolemic shock secondary to acute blood loss, now circulatory shock suspect cardiogenic component at least somewhat mediated by acidemia CAD with complete occlusion of LAD status post PCI Pericardial effusion due to PCI perforation Acute respiratory failure requiring mechanical ventilation Hyponatremia Acute metabolic encephalopathy Multiple organ failure Acute kidney injury Lactic acidosis Leukocytosis  Discussion Although it looks as though his hemoglobin is stabilized, he is now in refractory shock with multiple organ failure and profound lactic acidosis.  Unfortunately I think the insult from the last 24-hour is not something he will recover from.  Had a long discussion with his wife Christopher Ferguson, and daughter Christopher Ferguson.  The patient would not want to continue aggressive support.  At baseline he struggles with significant discomfort and disability.  Review decision was made to transition to full comfort  Plan Titrating fentanyl for pain Continue as needed Versed as needed Extubate to nasal cannula Atropine for oral secretions Discontinue vasoactive drips following extubation Maximize comfort Supportive care       Best practice:  Diet: N.p.o. Pain/Anxiety/Delirium protocol (if indicated): Fentanyl VAP protocol (if indicated): Yes DVT prophylaxis: SCD GI prophylaxis: N/A Glucose control: N/A Mobility: Bedrest Code Status: Full Family Communication: Update family Disposition: ICU for pressor support and ventilator management   Critical care time:  32 minutes    Erick Colace ACNP-BC [facility] Pager # [phone] OR # [phone] if no answer

## 2020-04-13 NOTE — Progress Notes (Signed)
Pt made comfort care by family. Pt terminally extubated at 0935 and all IV drips were turned off. Pt expired at 0955 with family at the bedside. This RN and Andreas Blower, RN pronounced death. Family refused chaplain assistance. [facility] called, referral # [id number]. Spoke with Marybelle Killings and informed that pt may be suitable for tissue donation, but they will determine. Attending MD notified of death.

## 2020-04-13 DEATH — deceased

## 2020-10-15 IMAGING — CT CT ANGIO CHEST-ABD-PELV FOR DISSECTION W/ AND WO/W CM
3 of 10 series · 6 of 46 positions shown, 10 images · IV contrast (APPLIED)
Comparison: None.

CLINICAL DATA: 83-year-old male with a history of recent coronary
intervention.

EXAM:
CT ANGIOGRAPHY CHEST, ABDOMEN AND PELVIS
TECHNIQUE: Non-contrast CT of the chest was initially obtained.

[Series 3: cap without · axial · non-contrast · 0.75mm/px · z∈[-564,-234]mm · 3 of 133 slices shown, 7 images]
[im 34/133  soft-tissue]
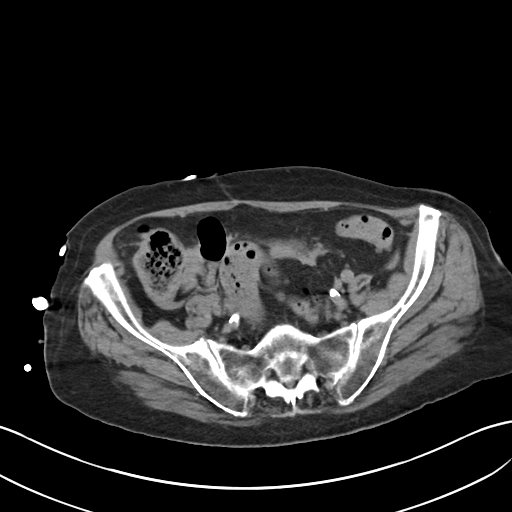
[im 34/133  lung]
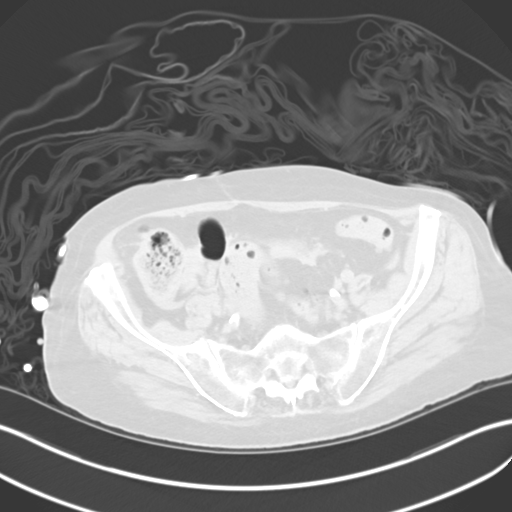
[im 34/133  bone]
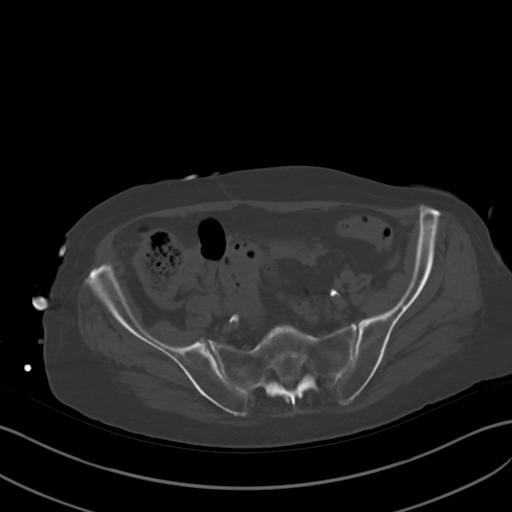
[im 67/133  soft-tissue]
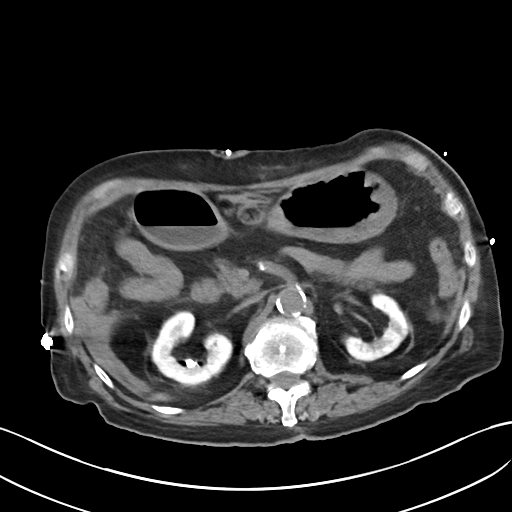
[im 67/133  lung]
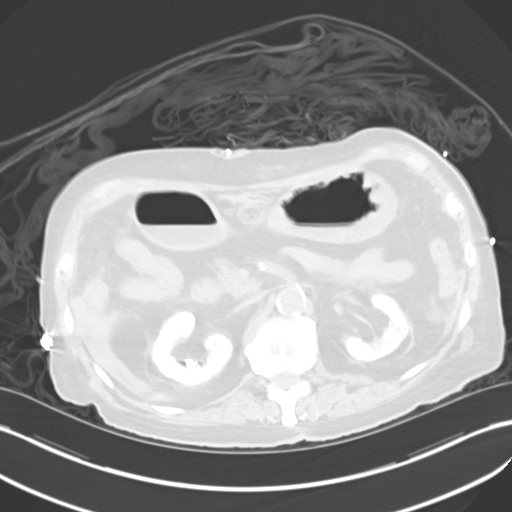
[im 100/133  soft-tissue]
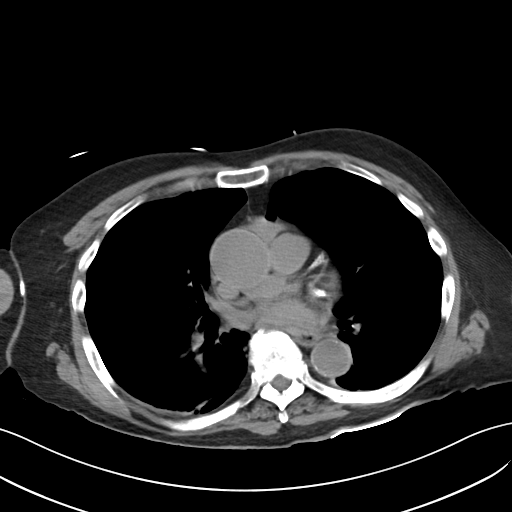
[im 100/133  lung]
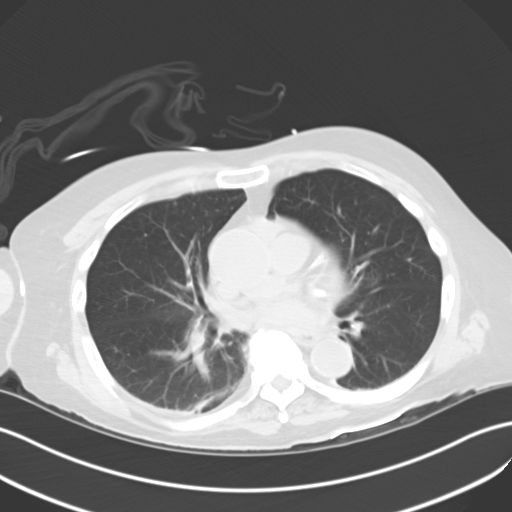

[Series 7: sag · sagittal · 0.46mm/px · 1 of 171 slices shown]
[im 131/171  soft-tissue]
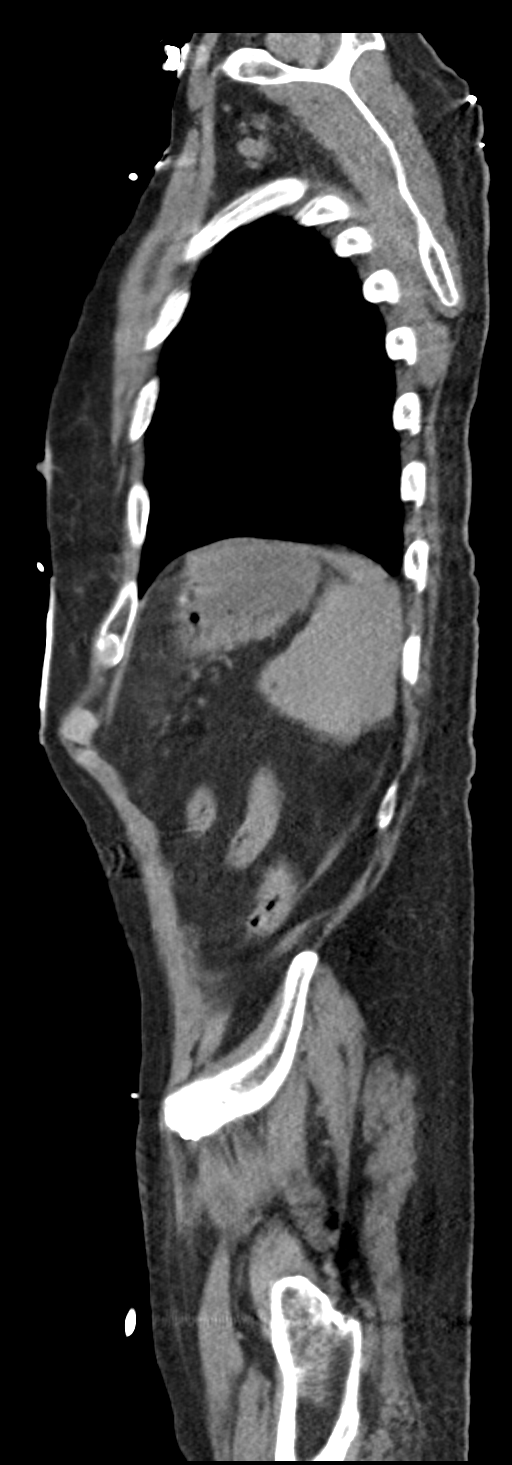

[Series 13: cor · coronal · 0.72mm/px · 2 of 149 slices shown]
[im 50/149  soft-tissue]
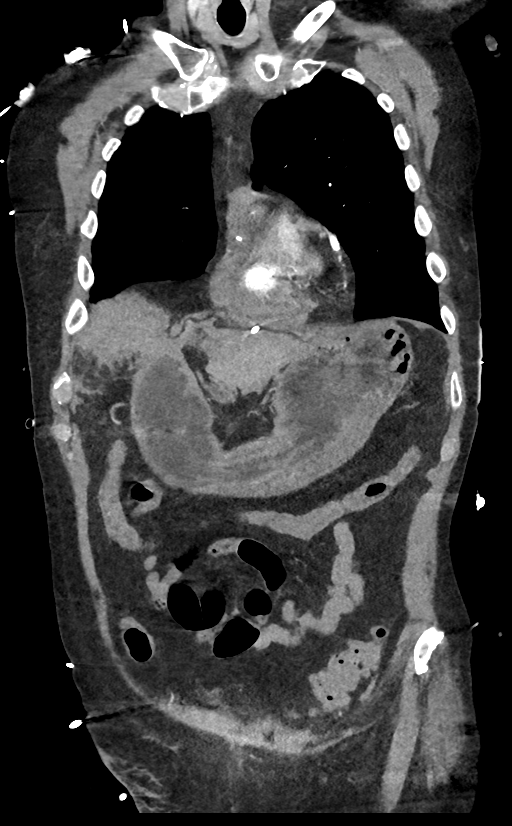
[im 99/149  soft-tissue]
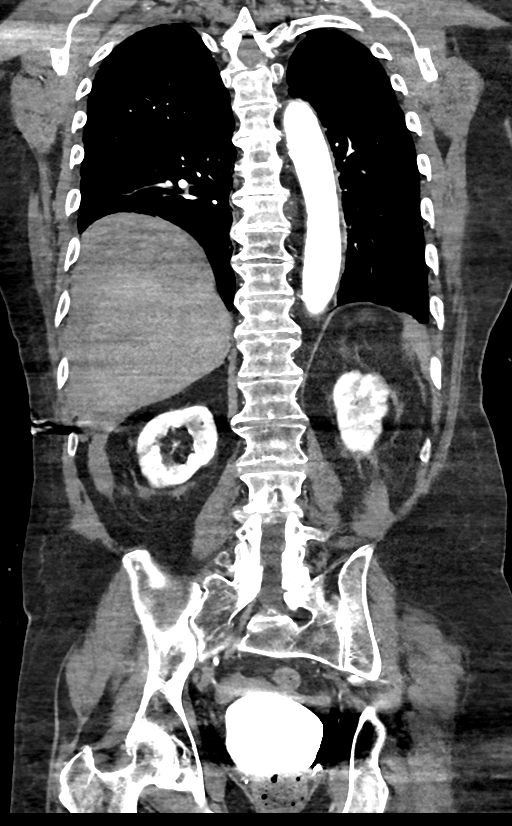

[6 of 46 positions shown; findings below may reference images not displayed]

Multidetector CT imaging through the chest, abdomen and pelvis was
performed using the standard protocol during bolus administration of
intravenous contrast. Multiplanar reconstructed images and MIPs were
obtained and reviewed to evaluate the vascular anatomy.

CONTRAST:  80mL OMNIPAQUE IOHEXOL 350 MG/ML SOLN
FINDINGS: CTA CHEST FINDINGS

Cardiovascular:

Heart:

Pericardial drain from subxiphoid approach, terminates in the
pericardial space, inferiorly. Small volume of gas anterior
pericardial space adjacent to small volume residual hemopericardium.

Note that the course of the pericardial drain crosses a branch
coronary artery on image 118 of series 10, potentially contributing
to some compression as there is decreased attenuation within the
artery branch at this site.

Right coronary: Patent stent system in the origin of the right
coronary artery. Native atherosclerotic disease of the right
coronary artery.

Main coronary artery: Patent. Native atherosclerotic calcifications
of the left anterior descending coronary artery. Small punctate
density adjacent to the LAD on image 87 of series 10, which is not
definitely present on the pre contrast, potentially extravasation of
contrast versus artifact on this non gated study.

Circumflex coronary artery: Atherosclerotic changes.

Aorta:

No significant valve calcifications. Diameter of the ascending aorta
measures 41 mm. Patent branch vessels. Three vessel arch. Type 3
arch.

Mild atherosclerosis of the descending thoracic aorta. No dissection
or periaortic fluid.

Pulmonary arteries:

Timing of the contrast bolus not optimized for the pulmonary
arteries. Main pulmonary artery measures 20 mm. No proximal filling
defects.

Mediastinum/Nodes: No mediastinal adenopathy. Unremarkable
appearance of the thoracic esophagus.

Unremarkable appearance of the thoracic inlet

Lungs/Pleura: No pneumothorax. No pleural effusion. Peripheral
ground-glass opacification in the apices of the bilateral lungs,
without interlobular septal thickening. No comparison. Linear
opacities at the bilateral lung bases.

CTA ABDOMEN AND PELVIS FINDINGS

VASCULAR

Aorta: Atherosclerotic changes of the abdominal aorta. No dissection
or aneurysm. No periaortic fluid.

Celiac: Celiac artery patent with mild atherosclerosis. There is
poor distal opacification of the splenic artery and the hepatic
arteries.

SMA: Atherosclerotic changes at the origin of the SMA without
high-grade stenosis. Partially calcified aneurysm associated with
the origin of SMA branch artery (image 158 of series 10). Aneurysm
measures 5 mm, partially wall calcified.

Renals:

- Right: Main right renal artery patent, small caliber. There are 2
accessory right renal arteries, both small caliber arising from the
9 o'clock and 10 o'clock position of the aorta just above the main
right renal artery.

- Left: Main left renal artery with mild atherosclerosis and no
high-grade stenosis. Small caliber left renal artery.

IMA: Inferior mesenteric artery is patent.

Right lower extremity:

Mild atherosclerosis of the right iliac system. No high-grade
stenosis or occlusion. Hypogastric artery is patent.

Right common femoral artery sheath. No evidence of local hematoma
aneurysm/pseudoaneurysm. No significant hemorrhage adjacent to the
right iliac artery.

Proximal profunda femoris and SFA patent

Left lower extremity:

Mild atherosclerosis of the left iliac system. No high-grade
stenosis or occlusion. Hypogastric artery is patent. External iliac
artery patent. Minimal atherosclerosis of the left common femoral
artery. Proximal profunda femoris and SFA patent.

Veins: Right common femoral vein sheath terminates in the iliac
system.

Review of the MIP images confirms the above findings.

NON-VASCULAR

Hepatobiliary: Intermediate density fluid surrounding the liver,
overlying right liver and the gallbladder. No evidence of
extravasation of contrast that was identified active extravasation.
Stranding overlying the left liver, in the region of the
percutaneous pericardial drain. Small volume of intermediate density
fluid adjacent to the left liver.

Pancreas: Unremarkable.

Spleen: Punctate calcifications of spleen parenchyma.

Adrenals/Urinary Tract:

- Right adrenal gland: Unremarkable

- Left adrenal gland: Unremarkable.

- Right kidney: Persistent nephrogram of the right kidney on the
noncontrast CT. Retained contrast within the calices. Cortical
thinning.

- Left Kidney: Persistent nephrogram of the left kidney. Contrast
within the calices. Cortical thinning

- Urinary Bladder: Contrast within the urinary bladder.

Stomach/Bowel:

- Stomach: Unremarkable.

- Small bowel: Unremarkable

- Appendix: Appendix is not visualized, however, no inflammatory
changes are present adjacent to the cecum to indicate an
appendicitis.

- Colon: Moderate stool burden. Diverticular disease. No focal
inflammatory changes.

Lymphatic: No adenopathy.

Mesenteric: High density fluid, small volume within the right
pelvis.

Reproductive: Unremarkable prostate.

Other: No hernia.

Musculoskeletal: Multilevel degenerative changes of the visualized
spine. Surgical changes of laminectomy spanning L3-L5. Greatest
degree of canal narrowing in the thoracic region at the disc space
of T9-T10 secondary to posterior disc osteophyte complex and facet
spurring posteriorly. No acute displaced fracture. Mild degenerative
changes of the hips.
IMPRESSION: Hemoperitoneum, with the largest volume of blood adjacent to the
right liver and the gallbladder. There is no evidence of active
extravasation. There is also a smaller volume of blood products
adjacent to the percutaneous pericardial drain and overlying the
left liver lobe.

Small volume of right pelvic hemoperitoneum, likely redistributed
from the upper abdomen, as there is no evidence of vascular injury
of the femoral or iliac vasculature.

Small residual hemopericardium, anteriorly, with percutaneous
pericardial drain in position. The course of the pericardial drain
is immediately adjacent to a vascular structure at the base of the
heart, potentially posterior descending artery with decreased
attenuation on the CT. This may be artifact or possibly compression
by the presence of the drain.

Native coronary artery disease with patent right coronary artery
stent system. There is a questionable focus of extraluminal contrast
adjacent to the left anterior descending coronary artery, and
extravasation cannot be excluded based on the imaging findings. This
detail was shared with Dr. Idirisu upon initial preliminary review.

The above preliminary results were discussed via telephone at the
time of interpretation on 03/30/2020 at [DATE] with Dr. Idirisu.

Minimal ground-glass attenuation at the periphery of the lung apices
with the differential including early edema, contusion/aspiration,
and/or inflammatory/infectious change.

Persisting nephrogram of the bilateral kidneys on the noncontrast
phase, suggesting acute renal injury.

Small aneurysm involving the origin of a branch of the SMA, possibly
inferior pancreaticoduodenal artery, measuring 5 mm, partially rim
calcified.

Aortic Atherosclerosis ([id number]-D64.4).

Additional ancillary findings as above.
